# Patient Record
Sex: Female | Born: 1990 | Hispanic: No | Marital: Single | Smoking: Light tobacco smoker
Health system: Southern US, Community
[De-identification: ages and names within clinical notes are randomized; demographics above are authoritative.]

## PROBLEM LIST (undated history)

## (undated) DIAGNOSIS — F329 Major depressive disorder, single episode, unspecified: Secondary | ICD-10-CM

## (undated) DIAGNOSIS — J45909 Unspecified asthma, uncomplicated: Secondary | ICD-10-CM

## (undated) DIAGNOSIS — O0990 Supervision of high risk pregnancy, unspecified, unspecified trimester: Secondary | ICD-10-CM

## (undated) DIAGNOSIS — F32A Depression, unspecified: Secondary | ICD-10-CM

## (undated) DIAGNOSIS — G43909 Migraine, unspecified, not intractable, without status migrainosus: Secondary | ICD-10-CM

## (undated) DIAGNOSIS — Z789 Other specified health status: Secondary | ICD-10-CM

## (undated) DIAGNOSIS — E669 Obesity, unspecified: Secondary | ICD-10-CM

## (undated) DIAGNOSIS — Z8619 Personal history of other infectious and parasitic diseases: Secondary | ICD-10-CM

## (undated) DIAGNOSIS — F419 Anxiety disorder, unspecified: Secondary | ICD-10-CM

## (undated) HISTORY — DX: Anxiety disorder, unspecified: F41.9

## (undated) HISTORY — DX: Depression, unspecified: F32.A

## (undated) HISTORY — PX: WISDOM TOOTH EXTRACTION: SHX21

## (undated) HISTORY — DX: Personal history of other infectious and parasitic diseases: Z86.19

## (undated) HISTORY — DX: Other specified health status: Z78.9

## (undated) HISTORY — DX: Obesity, unspecified: E66.9

## (undated) HISTORY — DX: Migraine, unspecified, not intractable, without status migrainosus: G43.909

## (undated) HISTORY — PX: TONSILLECTOMY AND ADENOIDECTOMY: SUR1326

---
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
## 2012-10-04 DIAGNOSIS — F410 Panic disorder [episodic paroxysmal anxiety] without agoraphobia: Secondary | ICD-10-CM | POA: Diagnosis present

## 2018-02-21 DIAGNOSIS — F411 Generalized anxiety disorder: Secondary | ICD-10-CM | POA: Diagnosis present

## 2018-10-26 NOTE — L&D Delivery Note (Addendum)
Delivery Note At 9:30 AM a viable female was delivered via Vaginal, Spontaneous (Presentation: vertex; LOP with compound hand).  APGAR: 9, 9; weight see delivery summary.   Placenta status: Spontaneous, intact.  Cord: 3 vessel with the following complications: body cord, reduced after delivery.  Anesthesia: Epidural  Episiotomy: None Lacerations: 2nd degree;Vaginal, 1st degree superficial near introitus-hemostatic Suture Repair: 3.0 vicryl rapide Est. Blood Loss (mL): 228  Mom to postpartum.  Baby to Couplet care / Skin to Skin.  Gerlene Fee, DO Family Medicine PGY-1 05/31/2019, 10:06 AM  Midwife attestation: I was gloved and present for delivery in its entirety and I agree with the above resident's note.  Julianne Handler, CNM 10:10 AM

## 2018-11-08 DIAGNOSIS — Z3400 Encounter for supervision of normal first pregnancy, unspecified trimester: Secondary | ICD-10-CM | POA: Insufficient documentation

## 2018-11-08 DIAGNOSIS — Z87898 Personal history of other specified conditions: Secondary | ICD-10-CM

## 2018-11-08 DIAGNOSIS — F1291 Cannabis use, unspecified, in remission: Secondary | ICD-10-CM

## 2019-04-25 ENCOUNTER — Other Ambulatory Visit (HOSPITAL_COMMUNITY): Payer: Self-pay | Admitting: *Deleted

## 2019-04-25 ENCOUNTER — Encounter (HOSPITAL_COMMUNITY): Payer: Self-pay | Admitting: *Deleted

## 2019-04-25 DIAGNOSIS — O99213 Obesity complicating pregnancy, third trimester: Secondary | ICD-10-CM

## 2019-05-01 ENCOUNTER — Ambulatory Visit (HOSPITAL_COMMUNITY): Payer: Medicaid Other

## 2019-05-05 ENCOUNTER — Encounter (HOSPITAL_COMMUNITY): Payer: Self-pay

## 2019-05-09 ENCOUNTER — Other Ambulatory Visit: Payer: Self-pay

## 2019-05-09 ENCOUNTER — Ambulatory Visit (HOSPITAL_COMMUNITY)
Admission: RE | Admit: 2019-05-09 | Discharge: 2019-05-09 | Disposition: A | Discharge status: Home / Self Care | Payer: Medicaid Other | Source: Ambulatory Visit | Attending: Obstetrics and Gynecology | Admitting: Obstetrics and Gynecology

## 2019-05-09 ENCOUNTER — Encounter (HOSPITAL_COMMUNITY): Payer: Self-pay

## 2019-05-09 ENCOUNTER — Ambulatory Visit (HOSPITAL_COMMUNITY): Payer: Medicaid Other | Admitting: *Deleted

## 2019-05-09 VITALS — BP 119/77 | HR 90 | Temp 98.5°F | Ht 66.0 in

## 2019-05-09 DIAGNOSIS — O0990 Supervision of high risk pregnancy, unspecified, unspecified trimester: Secondary | ICD-10-CM | POA: Insufficient documentation

## 2019-05-09 DIAGNOSIS — Z3A36 36 weeks gestation of pregnancy: Secondary | ICD-10-CM | POA: Diagnosis not present

## 2019-05-09 DIAGNOSIS — O24419 Gestational diabetes mellitus in pregnancy, unspecified control: Secondary | ICD-10-CM | POA: Diagnosis not present

## 2019-05-09 DIAGNOSIS — O99213 Obesity complicating pregnancy, third trimester: Secondary | ICD-10-CM | POA: Insufficient documentation

## 2019-05-09 DIAGNOSIS — O0933 Supervision of pregnancy with insufficient antenatal care, third trimester: Secondary | ICD-10-CM | POA: Diagnosis not present

## 2019-05-09 DIAGNOSIS — O9213 Cracked nipple associated with lactation: Secondary | ICD-10-CM | POA: Diagnosis not present

## 2019-05-09 IMAGING — US US MFM OB DETAIL +14 WK
1 series · 13 of 28 positions shown · non-contrast
Comparison: none

[Series 1: us mfm ob detail +14 wk · 13 of 56 slices shown]
[im 3/56]
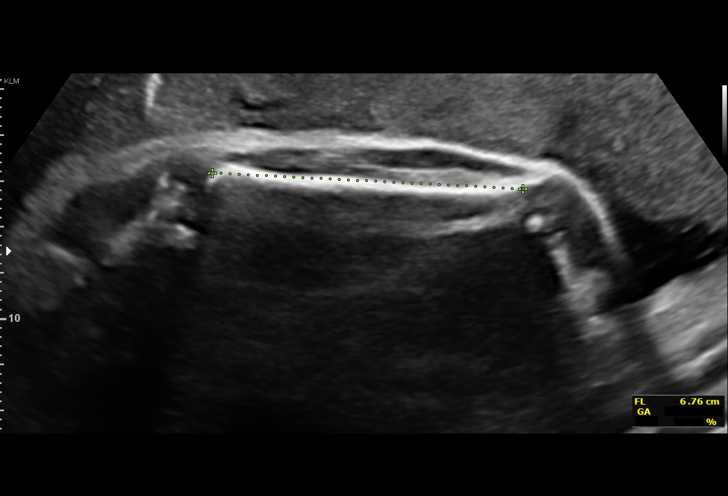
[im 7/56]
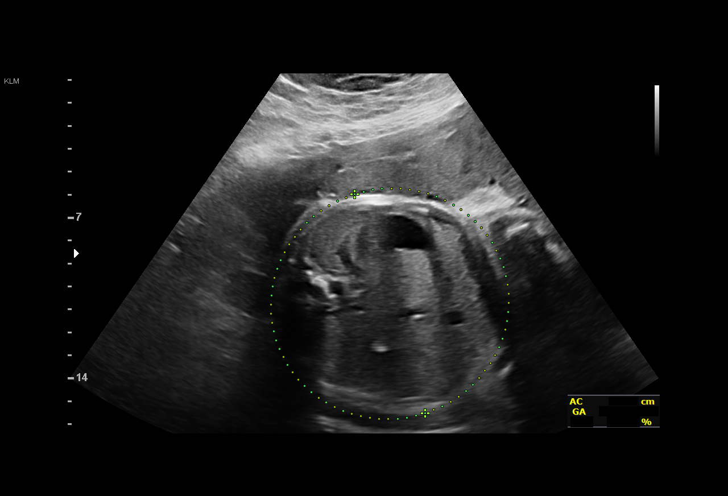
[im 11/56]
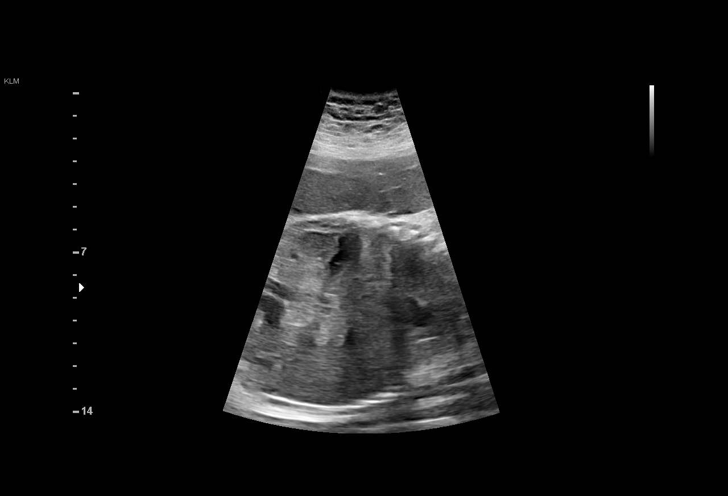
[im 15/56]
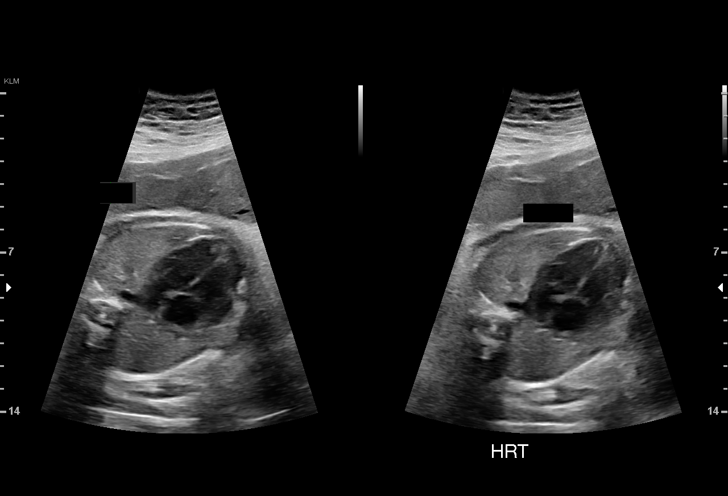
[im 19/56]
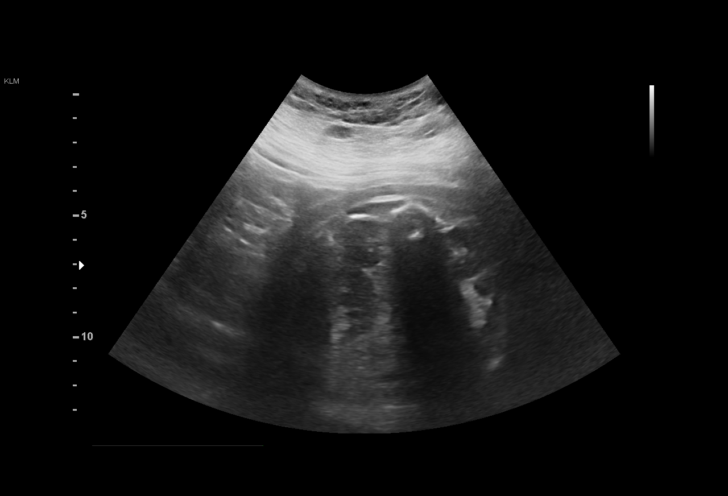
[im 23/56]
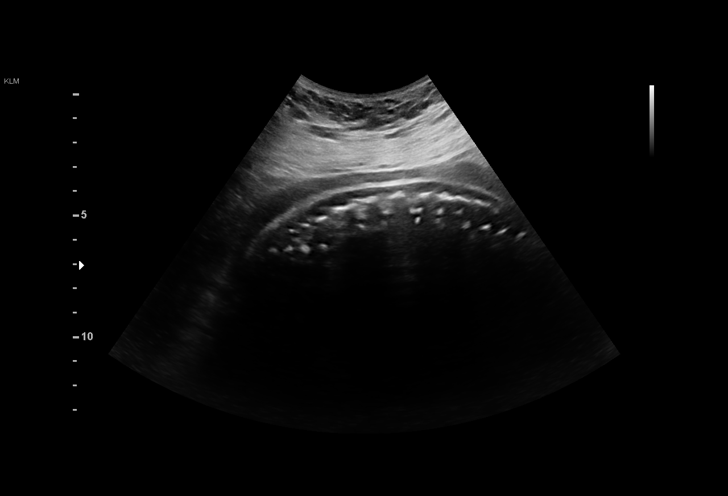
[im 29/56]
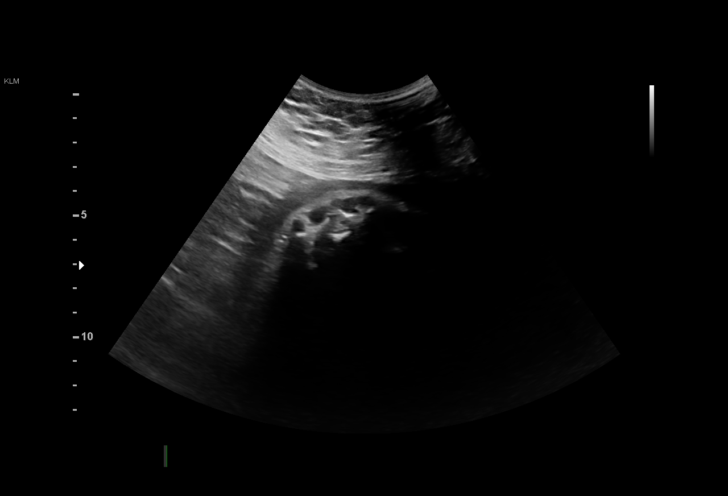
[im 33/56]
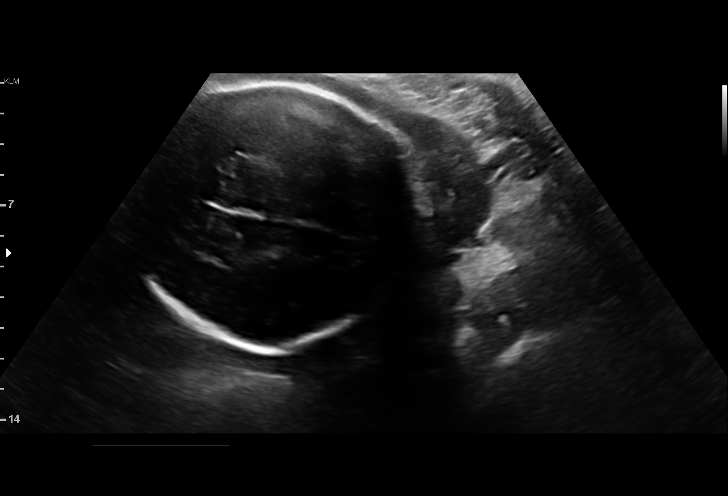
[im 37/56]
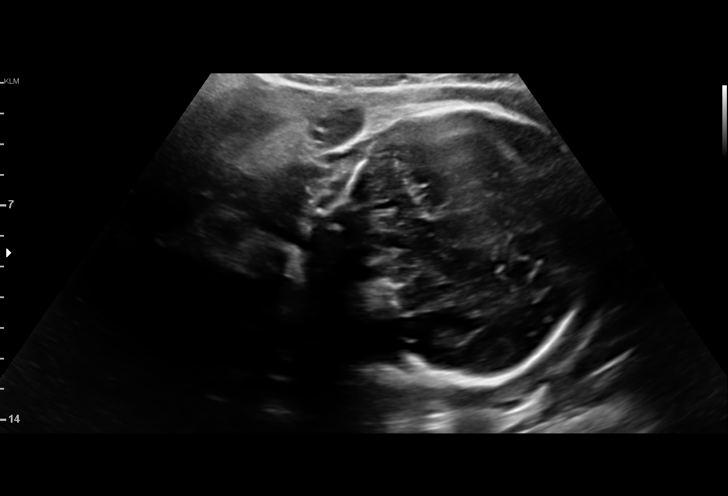
[im 41/56]
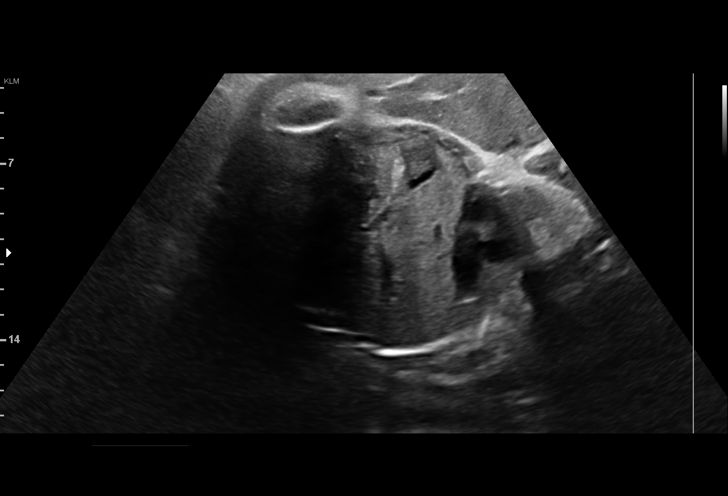
[im 45/56]
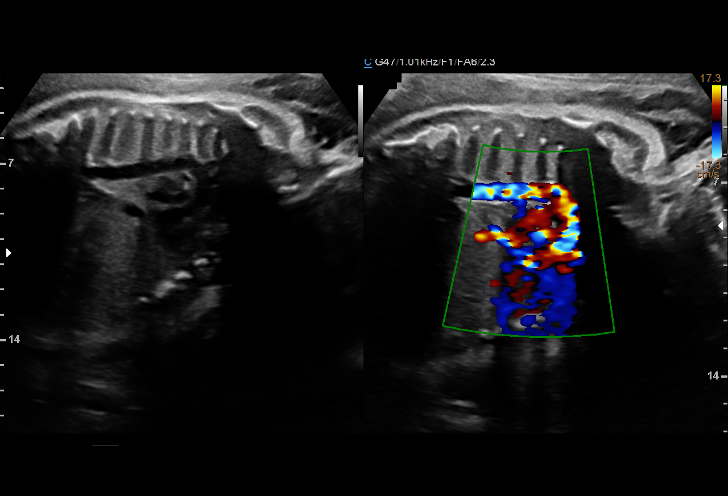
[im 49/56]
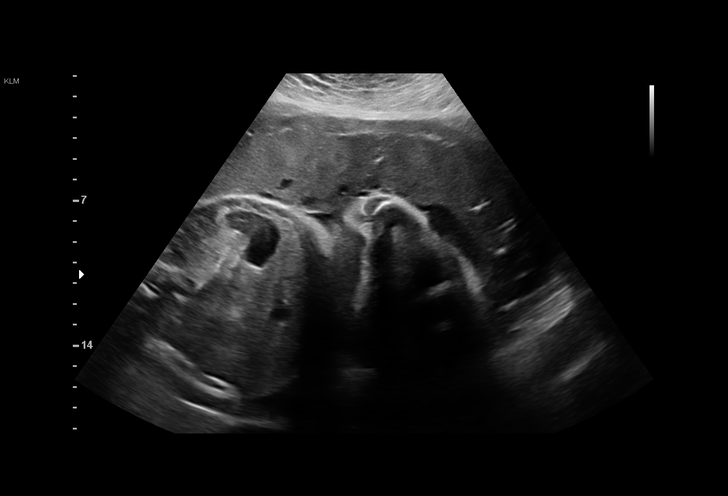
[im 53/56]
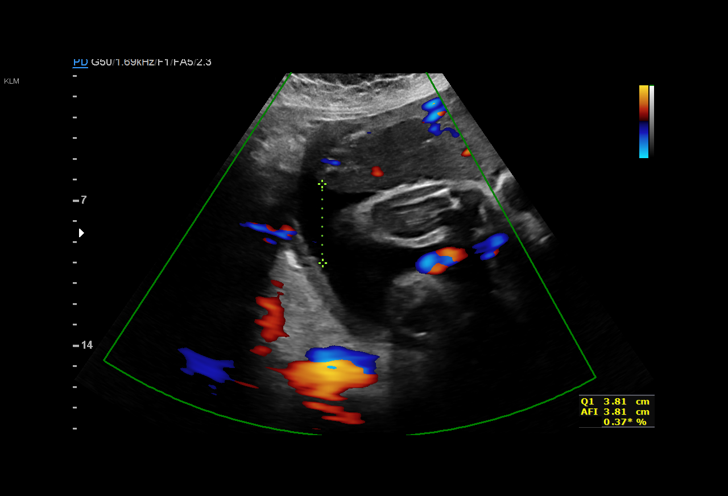

[13 of 28 positions shown; findings below may reference images not displayed]

[facility] NP
 Ref. Address:     [3Z] [HOSPITAL]
                   [HOSPITAL][HOSPITAL]

 ----------------------------------------------------------------------

 ----------------------------------------------------------------------
Indications

  Diabetes - Gestational                         [3Z]
  Encounter for antenatal screening for          [3Z]
  malformations
  36 weeks gestation of pregnancy
  Late to prenatal care, third trimester         [3Z]
  Obesity complicating pregnancy, third          [3Z]
  trimester
 ----------------------------------------------------------------------
Vital Signs

 BMI:
Fetal Evaluation

 Num Of Fetuses:         1
 Fetal Heart Rate(bpm):  145
 Cardiac Activity:       Observed
 Presentation:           Cephalic
 Placenta:               Anterior
 P. Cord Insertion:      Visualized

 Amniotic Fluid
 AFI FV:      Within normal limits

 AFI Sum(cm)     %Tile       Largest Pocket(cm)
 12.5            41

 RUQ(cm)       RLQ(cm)       LUQ(cm)        LLQ(cm)

Biometry

 BPD:      86.2  mm     G. Age:  34w 5d         17  %    CI:         73.6   %    70 - 86
                                                         FL/HC:      21.4   %    20.1 -
 HC:      319.2  mm     G. Age:  36w 0d         13  %    HC/AC:      1.00        0.93 -
 AC:       318   mm     G. Age:  35w 5d         41  %    FL/BPD:     79.4   %    71 - 87
 FL:       68.4  mm     G. Age:  35w 1d         16  %    FL/AC:      21.5   %    20 - 24
 HUM:      61.6  mm     G. Age:  35w 5d         52  %

 Est. FW:    [3Z]  gm    5 lb 15 oz      28  %
OB History

 Gravidity:    3         Term:   0        Prem:   0        SAB:   0
 TOP:          2       Ectopic:  0        Living: 0
Gestational Age

 LMP:           36w 3d        Date:  [DATE]                 EDD:   [DATE]
 U/S Today:     35w 3d                                        EDD:   [DATE]
 Best:          36w 3d     Det. By:  LMP  ([DATE])          EDD:   [DATE]
Anatomy

 Cranium:               Appears normal         Aortic Arch:            Appears normal
 Cavum:                 Appears normal         Ductal Arch:            Not well visualized
 Ventricles:            Appears normal         Diaphragm:              Appears normal
 Choroid Plexus:        Appears normal         Stomach:                Appears normal, left
                                                                       sided
 Cerebellum:            Appears normal         Abdomen:                Appears normal
 Posterior Fossa:       Not well visualized    Abdominal Wall:         Not well visualized
 Nuchal Fold:           Not applicable (>20    Cord Vessels:           Not well visualized
                        wks GA)
 Face:                  Not well visualized    Kidneys:                Appear normal
 Lips:                  Not well visualized    Bladder:                Appears normal
 Thoracic:              Appears normal         Spine:                  Appears normal
 Heart:                 Appears normal         Upper Extremities:      Not well visualized
                        (4CH, axis, and
                        situs)
 RVOT:                  Not well visualized    Lower Extremities:      Appears normal
 LVOT:                  Not well visualized

 Other:  Technically difficult due to advanced GA and fetal position.
Cervix Uterus Adnexa

 Cervix
 Not visualized (advanced GA >[3Z])
Impression

 Ms. ROMI ALEJANDRO3 P0 at 36 [DATE]weeks' gestation, is here for
 ultrasound evaluation. She has sporadic prenatal care.
 Patient reports she has gestational diabetes (GDM) that is
 well-controlled on diet.
 She has transferred her prenatal care to [facility] ([HOSP[facility]
 [facility]. She has a new Ob appointment
 with [facility] tomorrow.
 Fetal growth is appropriate for gestational age. Amniotic fluid
 is normal and good fetal activity is seen. Fetal anatomy
 appears normal, but limited by advanced gestational age.
 We reassured the patient of the findings.
Recommendations

 -Antenatal testing may be initiated if the patient needs oral
 hypoglycemics or insulin for control of GDM.
                 [facility]

## 2019-05-10 ENCOUNTER — Ambulatory Visit (INDEPENDENT_AMBULATORY_CARE_PROVIDER_SITE_OTHER): Payer: Medicaid Other | Admitting: Obstetrics and Gynecology

## 2019-05-10 ENCOUNTER — Other Ambulatory Visit (HOSPITAL_COMMUNITY)
Admission: RE | Admit: 2019-05-10 | Discharge: 2019-05-10 | Disposition: A | Discharge status: Home / Self Care | Payer: Medicaid Other | Source: Ambulatory Visit | Attending: Obstetrics and Gynecology | Admitting: Obstetrics and Gynecology

## 2019-05-10 ENCOUNTER — Encounter: Payer: Self-pay | Admitting: Obstetrics and Gynecology

## 2019-05-10 DIAGNOSIS — O0992 Supervision of high risk pregnancy, unspecified, second trimester: Secondary | ICD-10-CM

## 2019-05-10 DIAGNOSIS — O24410 Gestational diabetes mellitus in pregnancy, diet controlled: Secondary | ICD-10-CM

## 2019-05-10 DIAGNOSIS — O24419 Gestational diabetes mellitus in pregnancy, unspecified control: Secondary | ICD-10-CM | POA: Insufficient documentation

## 2019-05-10 DIAGNOSIS — Z3A36 36 weeks gestation of pregnancy: Secondary | ICD-10-CM

## 2019-05-10 DIAGNOSIS — O0990 Supervision of high risk pregnancy, unspecified, unspecified trimester: Secondary | ICD-10-CM

## 2019-05-10 HISTORY — DX: Supervision of high risk pregnancy, unspecified, unspecified trimester: O09.90

## 2019-05-10 NOTE — Patient Instructions (Signed)

## 2019-05-10 NOTE — Progress Notes (Signed)
History:   Tammy Kerr is a 27 y.o. G3P0020 at 66w4dby LMP being seen today for her first obstetrical visit.  Her obstetrical history is significant for obesity and A1 GDM. Patient does intend to breast feed. Pregnancy history fully reviewed. [name]  Patient reports no complaints. HISTORY: OB History  Gravida Para Term Preterm AB Living  3 0 0 0 2 0  SAB TAB Ectopic Multiple Live Births  0 2 0 0 0    # Outcome Date GA Lbr Len/2nd Weight Sex Delivery Anes PTL Lv  3 Current           2 TAB           1 TAB             Last pap smear was done 5/19 and was normal  Past Medical History:  Diagnosis Date  . Anxiety   . Depression   . History of trichomoniasis   . Medical history non-contributory   . Migraines   . Obesity    Past Surgical History:  Procedure Laterality Date  . TONSILLECTOMY AND ADENOIDECTOMY    . TONSILLECTOMY AND ADENOIDECTOMY    . WISDOM TOOTH EXTRACTION     Family History  Problem Relation Age of Onset  . Ovarian cancer Maternal Grandmother   . Diabetes Maternal Grandmother   . Colon cancer Maternal Grandfather   . Heart disease Maternal Grandfather   . Arthritis Mother   . Depression Mother   . Anxiety disorder Mother   . Obesity Mother    Social History   Tobacco Use  . Smoking status: Light Tobacco Smoker    Types: Cigarettes  . Smokeless tobacco: Never Used  Substance Use Topics  . Alcohol use: Not Currently  . Drug use: Not Currently   No Known Allergies Current Outpatient Medications on File Prior to Visit  Medication Sig Dispense Refill  . Accu-Chek FastClix Lancets MISC Use to monitor blood glucose 4 time(s) daily    . aspirin EC 81 MG tablet Take 81 mg by mouth daily.    . Blood Glucose Monitoring Suppl (ACCU-CHEK GUIDE) w/Device KIT by Does not apply route.    .Marland Kitchenglucose blood (ACCU-CHEK GUIDE) test strip  Use to monitor blood glucose 4 time(s) daily    . Prenatal Vit-Fe Fumarate-FA (PRENATAL VITAMIN PO) Take by mouth.     No current facility-administered medications on file prior to visit.     Review of Systems Pertinent items noted in HPI and remainder of comprehensive ROS otherwise negative. Physical Exam:   Vitals:   05/10/19 0847  BP: 121/74  Pulse: 93  Weight: 261 lb (118.4 kg)   Fetal Heart Rate (bpm): 148 Uterus:  Fundal Height: 37 cm  Pelvic Exam: Perineum: no hemorrhoids, normal perineum   Vulva: normal external genitalia, no lesions   Vagina:  normal mucosa, normal discharge   Cervix: 1 cm, thick.   Adnexa: normal adnexa and no mass, fullness, tenderness   Bony Pelvis: average  System: General: well-developed, well-nourished female in no acute distress   Breasts:  normal appearance, no masses or tenderness bilaterally   Skin: normal coloration and turgor, no rashes   Neurologic: oriented, normal, negative, normal mood   Extremities: normal strength, tone, and muscle mass, ROM of all joints is normal   HEENT PERRLA, extraocular movement intact and sclera clear, anicteric  Mouth/Teeth mucous membranes moist, pharynx normal without lesions and dental hygiene good   Neck supple and no masses   Cardiovascular: regular rate and rhythm   Respiratory:  no respiratory distress, normal breath sounds   Abdomen: soft, non-tender; bowel sounds normal; no masses,  no organomegaly    Assessment:    Pregnancy: [id number] Patient Active Problem List   Diagnosis Date Noted  . Gestational diabetes 05/10/2019  . Supervision of high risk pregnancy, antepartum 05/10/2019     Plan:    1. Diet controlled gestational diabetes mellitus (GDM), antepartum - Patient reports fasting BS less than 95, and postprandial BS < 120. Patient was instructed by [facility] to check BS 2x per day. Will check meter with patient next visit.  - Culture, beta strep (group b only) - Cervicovaginal ancillary  only( [place]) - per MFM: Recommendations -Antenatal testing may be initiated if the patient needs oral  hypoglycemics or insulin for control of GDM - Discussed delivery at 40 weeks if BS remain in good control. - Continue BASA    2. Supervision of other normal pregnancy, antepartum  Virtual visit next week; weekly visits Growth [place] today shows 2391 gm    Awaiting records from [facility] from [place].  Continue prenatal vitamins. Genetic Screening discussed, Declined: declined. Ultrasound discussed; fetal anatomic survey: results reviewed. Problem list reviewed and updated. The nature of [place] with multiple MDs and other Advanced Practice Providers was explained to patient; also emphasized that residents, students are part of our team. Routine obstetric precautions reviewed. Return in about 1 week (around 05/17/2019), or Virtual visit ok._0    Tammy Kerr, Artist Pais, [place] for [employer], [place]

## 2019-05-11 ENCOUNTER — Telehealth: Payer: Self-pay | Admitting: *Deleted

## 2019-05-11 DIAGNOSIS — O24410 Gestational diabetes mellitus in pregnancy, diet controlled: Secondary | ICD-10-CM

## 2019-05-11 LAB — CERVICOVAGINAL ANCILLARY ONLY: Candida vaginitis: POSITIVE — AB

## 2019-05-11 NOTE — Telephone Encounter (Signed)
Spoke with patient about signing up for [place].  She has a BP cuff and she will do her BP and weight weekly and put into the app.  Pt instructed to call if she has any issues with setting up the app.  She was also instructed to take her BP the morning of her virtual visits so that it can be documented.

## 2019-05-12 LAB — CULTURE, BETA STREP (GROUP B ONLY)
MICRO NUMBER:: 669801
SPECIMEN QUALITY:: ADEQUATE

## 2019-05-16 ENCOUNTER — Encounter: Payer: Self-pay | Admitting: Certified Nurse Midwife

## 2019-05-16 ENCOUNTER — Telehealth (INDEPENDENT_AMBULATORY_CARE_PROVIDER_SITE_OTHER): Payer: Medicaid Other | Admitting: Certified Nurse Midwife

## 2019-05-16 VITALS — BP 128/76 | Wt 260.1 lb

## 2019-05-16 DIAGNOSIS — O24410 Gestational diabetes mellitus in pregnancy, diet controlled: Secondary | ICD-10-CM

## 2019-05-16 DIAGNOSIS — Z3A37 37 weeks gestation of pregnancy: Secondary | ICD-10-CM

## 2019-05-16 DIAGNOSIS — O0993 Supervision of high risk pregnancy, unspecified, third trimester: Secondary | ICD-10-CM

## 2019-05-16 DIAGNOSIS — O0990 Supervision of high risk pregnancy, unspecified, unspecified trimester: Secondary | ICD-10-CM

## 2019-05-16 NOTE — Progress Notes (Signed)
routine

## 2019-05-16 NOTE — Progress Notes (Signed)
[place] VIRTUAL VIDEO VISIT ENCOUNTER NOTE  Provider location: [facility] [place] [facility] [place]   I connected with Tammy Kerr on 05/16/19 at 10:00 AM EDT by MyChart Video Encounter at home and verified that I am speaking with the correct person using two identifiers.   I discussed the limitations, risks, security and privacy concerns of performing an evaluation and management service virtually and the availability of in person appointments. I also discussed with the patient that there may be a patient responsible charge related to this service. The patient expressed understanding and agreed to proceed. Subjective:  Tammy Kerr is a 28 y.o. G3P0020 at [redacted]w[redacted]d being seen today for ongoing prenatal care.  She is currently monitored for the following issues for this high-risk pregnancy and has Gestational diabetes and Supervision of high risk pregnancy, antepartum on their problem list.  Patient reports no complaints.  Contractions: Irregular.  .  Movement: Present. Denies any leaking of fluid.   The following portions of the patient's history were reviewed and updated as appropriate: allergies, current medications, past family history, past medical history, past social history, past surgical history and problem list.   Objective:   Vitals:   05/16/19 1038  BP: 128/76  Weight: 260 lb 2.3 oz (118 kg)    Fetal Status:     Movement: Present     General:  Alert, oriented and cooperative. Patient is in no acute distress.  Respiratory: Normal respiratory effort, no problems with respiration noted  Mental Status: Normal mood and affect. Normal behavior. Normal judgment and thought content.  Rest of physical exam deferred due to type of encounter  Imaging: [place] Mfm Ob Detail +14 Wk  Result Date: 05/09/2019 ----------------------------------------------------------------------  OBSTETRICS REPORT                       (Signed Final 05/09/2019 09:17 am)  ---------------------------------------------------------------------- Patient Info  ID #:       [id number]                          D.O.B.:  1991/02/11 (27 yrs)  Name:       Tammy Kerr                   Visit Date: 05/09/2019 08:31 am ---------------------------------------------------------------------- Performed By  Performed By:     Berlinda Last          Secondary Phy.:   Artist Pais                    RDMS                                                             [facility] NP  Attending:        Tama High MD        Address:          [address]  [address] [place],                                                             [place] [id number]  Referred By:      [facility] [place]       Location:         [facility]  Ref. Address:     [address]                    , [place] ---------------------------------------------------------------------- Orders   #  Description                          Code         Ordered By   1  US MFM OB DETAIL +14 WK              [id number]     Arlan OrganANIELA PAUL  ----------------------------------------------------------------------   #  Order #                    Accession #                 Episode #   1  [id number]                  [id number]                  [id number]  ---------------------------------------------------------------------- Indications   Diabetes - Gestational                         75O24.419   Encounter for antenatal screening for          Z36.3   malformations   [redacted] weeks gestation of pregnancy                Z3A.36   Late to prenatal care, third trimester         O09.33   Obesity complicating pregnancy, third          O99.213   trimester  ---------------------------------------------------------------------- Vital Signs  Weight (lb): 260                               Height:        5'6"  BMI:         41.96  ---------------------------------------------------------------------- Fetal Evaluation  Num Of Fetuses:         1  Fetal Heart Rate(bpm):  145  Cardiac Activity:       Observed  Presentation:           Cephalic  Placenta:               Anterior  P. Cord Insertion:      Visualized  Amniotic Fluid  AFI FV:  Within normal limits  AFI Sum(cm)     %Tile       Largest Pocket(cm)  12.5            41          3.97  RUQ(cm)       RLQ(cm)       LUQ(cm)        LLQ(cm)  3.81          1.81          2.91           3.97 ---------------------------------------------------------------------- Biometry  BPD:      86.2  mm     G. Age:  34w 5d         17  %    CI:         73.6   %    70 - 86                                                          FL/HC:      21.4   %    20.1 - 22.1  HC:      319.2  mm     G. Age:  36w 0d         13  %    HC/AC:      1.00        0.93 - 1.11  AC:       318   mm     G. Age:  35w 5d         41  %    FL/BPD:     79.4   %    71 - 87  FL:       68.4  mm     G. Age:  35w 1d         16  %    FL/AC:      21.5   %    20 - 24  HUM:      61.6  mm     G. Age:  35w 5d         52  %  Est. FW:    2691  gm    5 lb 15 oz      28  % ---------------------------------------------------------------------- OB History  Gravidity:    3         Term:   0        Prem:   0        SAB:   0  TOP:          2       Ectopic:  0        Living: 0 ---------------------------------------------------------------------- Gestational Age  LMP:           36w 3d        Date:  08/27/18                 EDD:   06/03/19  U/S Today:     35w 3d                                        EDD:  06/10/19  Best:          36w 3d     Det. By:  LMP  (08/27/18)          EDD:   06/03/19 ---------------------------------------------------------------------- Anatomy  Cranium:               Appears normal         Aortic Arch:            Appears normal  Cavum:                 Appears normal         Ductal Arch:            Not well visualized  Ventricles:             Appears normal         Diaphragm:              Appears normal  Choroid Plexus:        Appears normal         Stomach:                Appears normal, left                                                                        sided  Cerebellum:            Appears normal         Abdomen:                Appears normal  Posterior Fossa:       Not well visualized    Abdominal Wall:         Not well visualized  Nuchal Fold:           Not applicable (>20    Cord Vessels:           Not well visualized                         wks GA)  Face:                  Not well visualized    Kidneys:                Appear normal  Lips:                  Not well visualized    Bladder:                Appears normal  Thoracic:              Appears normal         Spine:                  Appears normal  Heart:                 Appears normal         Upper Extremities:      Not well visualized                         (4CH, axis, and  situs)  RVOT:                  Not well visualized    Lower Extremities:      Appears normal  LVOT:                  Not well visualized  Other:  Technically difficult due to advanced GA and fetal position. ---------------------------------------------------------------------- Cervix Uterus Adnexa  Cervix  Not visualized (advanced GA >24wks) ---------------------------------------------------------------------- Impression  Ms. Deyoung, G3 P0 at 136 3/7weeks' gestation, is here for  ultrasound evaluation. She has sporadic prenatal care.  Patient reports she has gestational diabetes (GDM) that is  well-controlled on diet.  [facility]  Fetal growth is appropriate for gestational age. Amniotic fluid  is normal and good fetal activity is seen. Fetal anatomy  appears normal, but limited by advanced gestational age.  We reassured the patient of the findings.  ---------------------------------------------------------------------- Recommendations  -Antenatal testing may be initiated if the patient needs oral  hypoglycemics or insulin for control of GDM. ----------------------------------------------------------------------                  Noralee Spaceavi Shankar, MD Electronically Signed Final Report   05/09/2019 09:17 am ----------------------------------------------------------------------   Assessment and Plan:  Pregnancy: G3P0020 at 3029w3d 1. Supervision of high risk pregnancy, antepartum -BP 128/76 today  - weight 260lb 2.3oz - Patient doing well, no complaints - Anticipatory guidance on upcoming appointments - Reviewed safety, visitor policy, reassurance about COVID-19 for pregnancy at this time. Discussed possible changes to visits, including televisits, that may occur due to COVID-19.  The office remains open if pt needs to be seen and MAU is open 24 hours/day for OB emergencies. - Educated on use of EPO, RRT, and IC to induce contractions at home  2. Diet controlled gestational diabetes mellitus (GDM) in third trimester - Stable at this time, not on medication  Fasting <90- range from 70, 80, and 86 2hr PP <120 No abnormal CBG levels  - educated and discussed delivery by 40 weeks with A1GDM, patient verbalizes understanding   Term labor symptoms and general obstetric precautions including but not limited to vaginal bleeding, contractions, leaking of fluid and fetal movement were reviewed in detail with the patient. I discussed the assessment and treatment plan with the patient. The patient was provided an opportunity to ask questions and all were answered. The patient agreed with the plan and demonstrated an understanding of the instructions. The patient was advised to call back or seek an in-person office evaluation/[facility] for any urgent or concerning symptoms. Please refer to After Visit Summary for other counseling  recommendations.   I provided 13 minutes of face-to-face time during this encounter.  Return in about 1 week (around 05/23/2019) for ROB-mychart.  Sharyon CableVeronica C Eward Rutigliano, CNM [facility], [facility]

## 2019-05-26 ENCOUNTER — Telehealth: Payer: Self-pay

## 2019-05-26 ENCOUNTER — Other Ambulatory Visit: Payer: Self-pay

## 2019-05-26 ENCOUNTER — Telehealth (INDEPENDENT_AMBULATORY_CARE_PROVIDER_SITE_OTHER): Payer: Medicaid Other | Admitting: Certified Nurse Midwife

## 2019-05-26 DIAGNOSIS — O0993 Supervision of high risk pregnancy, unspecified, third trimester: Secondary | ICD-10-CM

## 2019-05-26 DIAGNOSIS — Z3A38 38 weeks gestation of pregnancy: Secondary | ICD-10-CM

## 2019-05-26 DIAGNOSIS — O24410 Gestational diabetes mellitus in pregnancy, diet controlled: Secondary | ICD-10-CM

## 2019-05-26 DIAGNOSIS — O0990 Supervision of high risk pregnancy, unspecified, unspecified trimester: Secondary | ICD-10-CM

## 2019-05-26 NOTE — Telephone Encounter (Signed)
Attempted to call pt to begin virtual appt. Pt did not answer. VM left and number provided to call office.

## 2019-05-26 NOTE — Progress Notes (Signed)
TELEHEALTH OBSTETRICS PRENATAL VIRTUAL VIDEO VISIT ENCOUNTER NOTE  Provider [facility]: [facility] [place]   I connected with Tammy Kerr on 05/26/19 at 11:00 AM EDT by MyChart Video Encounter at home and verified that I am speaking with the correct person using two identifiers.   I discussed the limitations, risks, security and privacy concerns of performing an evaluation and management service virtually and the availability of in person appointments. I also discussed with the patient that there may be a patient responsible charge related to this service. The patient expressed understanding and agreed to proceed. Subjective:  Tammy Kerr is a 28 y.o. G3P0020 at 5194w6d being seen today for ongoing prenatal care.  She is currently monitored for the following issues for this high-risk pregnancy and has Gestational diabetes and Supervision of high risk pregnancy, antepartum on their problem list.  Patient reports no complaints.  Contractions: Not present.  .  Movement: Present. Denies any leaking of fluid.   The following portions of the patient's history were reviewed and updated as appropriate: allergies, current medications, past family history, past medical history, past social history, past surgical history and problem list.   Objective:   Vitals:   05/26/19 1058  BP: 134/86    Fetal Status:     Movement: Present     General:  Alert, oriented and cooperative. Patient is in no acute distress.  Respiratory: Normal respiratory effort, no problems with respiration noted  Mental Status: Normal mood and affect. Normal behavior. Normal judgment and thought content.  Rest of physical exam deferred due to type of encounter  Imaging: Koreas Mfm Ob Detail +14 Wk  Result Date: 05/09/2019 ----------------------------------------------------------------------  OBSTETRICS REPORT                       (Signed Final 05/09/2019 09:17 am)  ---------------------------------------------------------------------- Patient Info  ID #:       [id number]                          D.O.B.:  1990/12/13 (27 yrs)  Name:       Tammy Kerr                   Visit Date: 05/09/2019 08:31 am ---------------------------------------------------------------------- Performed By  Performed By:     Marcellina MillinKelly L Moser          Secondary Phy.:   Harolyn RutherfordJENNIFER I                    RDMS                                                             [facility] NP  Attending:        Noralee Spaceavi Shankar MD        Address:          [address]                                                             [address] [place],  [place] [address]  Referred By:      Everardo All       Location[facility]         [facility]  Ref. Address:     [address] [place] ---------------------------------------------------------------------- Orders   #  Description                          Code         Ordered By   1  [place] MFM OB DETAIL +14 WK              [id number]     Arlan Organ  ----------------------------------------------------------------------   #  Order #                    Accession #                 Episode #   1  [id number]                  [id number]                  [id number]  ---------------------------------------------------------------------- Indications   Diabetes - Gestational                         O107.419   Encounter for antenatal screening for          Z36.3   malformations   [redacted] weeks gestation of pregnancy                Z3A.36   Late to prenatal care, third trimester         O09.33   Obesity complicating pregnancy, third          O99.213   trimester  ---------------------------------------------------------------------- Vital Signs  Weight (lb): 260                               Height:        5'6"  BMI:         41.96  ---------------------------------------------------------------------- Fetal Evaluation  Num Of Fetuses:         1  Fetal Heart Rate(bpm):  145  Cardiac Activity:       Observed  Presentation:           Cephalic  Placenta:               Anterior  P. Cord Insertion:      Visualized  Amniotic Fluid  AFI FV:      Within normal limits  AFI Sum(cm)     %Tile       Largest Pocket(cm)  12.5            41          3.97  RUQ(cm)       RLQ(cm)       LUQ(cm)  LLQ(cm)  3.81          1.81          2.91           3.97 ---------------------------------------------------------------------- Biometry  BPD:      86.2  mm     G. Age:  34w 5d         17  %    CI:         73.6   %    70 - 86                                                          FL/HC:      21.4   %    20.1 - 22.1  HC:      319.2  mm     G. Age:  36w 0d         13  %    HC/AC:      1.00        0.93 - 1.11  AC:       318   mm     G. Age:  35w 5d         41  %    FL/BPD:     79.4   %    71 - 87  FL:       68.4  mm     G. Age:  35w 1d         16  %    FL/AC:      21.5   %    20 - 24  HUM:      61.6  mm     G. Age:  35w 5d         52  %  Est. FW:    2691  gm    5 lb 15 oz      28  % ---------------------------------------------------------------------- OB History  Gravidity:    3         Term:   0        Prem:   0        SAB:   0  TOP:          2       Ectopic:  0        Living: 0 ---------------------------------------------------------------------- Gestational Age  LMP:           36w 3d        Date:  08/27/18                 EDD:   06/03/19  U/S Today:     35w 3d                                        EDD:   06/10/19  Best:          36w 3d     Det. By:  LMP  (08/27/18)          EDD:   06/03/19 ---------------------------------------------------------------------- Anatomy  Cranium:               Appears normal  Aortic Arch:            Appears normal  Cavum:                 Appears normal         Ductal Arch:            Not well visualized  Ventricles:             Appears normal         Diaphragm:              Appears normal  Choroid Plexus:        Appears normal         Stomach:                Appears normal, left                                                                        sided  Cerebellum:            Appears normal         Abdomen:                Appears normal  Posterior Fossa:       Not well visualized    Abdominal Wall:         Not well visualized  Nuchal Fold:           Not applicable (>54    Cord Vessels:           Not well visualized                         wks GA)  Face:                  Not well visualized    Kidneys:                Appear normal  Lips:                  Not well visualized    Bladder:                Appears normal  Thoracic:              Appears normal         Spine:                  Appears normal  Heart:                 Appears normal         Upper Extremities:      Not well visualized                         (4CH, axis, and                         situs)  RVOT:                  Not well visualized    Lower Extremities:      Appears normal  LVOT:  Not well visualized  Other:  Technically difficult due to advanced GA and fetal position. ---------------------------------------------------------------------- Cervix Uterus Adnexa  Cervix  Not visualized (advanced GA >24wks) ---------------------------------------------------------------------- Impression  Tammy Kerr, G3 P0 at 2436 3/7weeks' gestation, is here for  ultrasound evaluation. She has sporadic prenatal care.  Patient reports she has gestational diabetes (GDM) that is  well-controlled on diet.  [facility].  Fetal growth is appropriate for gestational age. Amniotic fluid  is normal and good fetal activity is seen. Fetal anatomy  appears normal, but limited by advanced gestational age.  We reassured the patient of the findings.  ---------------------------------------------------------------------- Recommendations  -Antenatal testing may be initiated if the patient needs oral  hypoglycemics or insulin for control of GDM. ----------------------------------------------------------------------                  Noralee Spaceavi Shankar, MD Electronically Signed Final Report   05/09/2019 09:17 am ----------------------------------------------------------------------   Assessment and Plan:  Pregnancy: G3P0020 at 957w6d 1. Supervision of high risk pregnancy, antepartum  2. Diet controlled gestational diabetes mellitus (GDM) in third trimester - well controlled w/diet; normal fetal growth and AFI - Reviewed log: FBS 72-93; pp 102-120 (1 outlier 135) - Plan NST/AFI early next week and schedule IOL  Term labor symptoms and general obstetric precautions including but not limited to vaginal bleeding, contractions, leaking of fluid and fetal movement were reviewed in detail with the patient. I discussed the assessment and treatment plan with the patient. The patient was provided an opportunity to ask questions and all were answered. The patient agreed with the plan and demonstrated an understanding of the instructions. The patient was advised to call back or seek an in-person office evaluation/[facility] for any urgent or concerning symptoms. Please refer to After Visit Summary for other counseling recommendations.   I provided 11 minutes of face-to-face time during this encounter.  Return in about 3 days (around 05/29/2019).  No future appointments.  Donette LarryMelanie Ceria Suminski, CNM [facility], [facility]

## 2019-05-30 ENCOUNTER — Other Ambulatory Visit: Payer: Self-pay

## 2019-05-30 ENCOUNTER — Encounter: Payer: Self-pay | Admitting: Advanced Practice Midwife

## 2019-05-30 ENCOUNTER — Encounter (HOSPITAL_COMMUNITY): Payer: Self-pay | Admitting: *Deleted

## 2019-05-30 ENCOUNTER — Inpatient Hospital Stay (HOSPITAL_COMMUNITY)
Admission: AD | Admit: 2019-05-30 | Discharge: 2019-06-01 | DRG: 807 | Disposition: A | Discharge status: Home / Self Care | Payer: Medicaid Other | Attending: Obstetrics and Gynecology | Admitting: Obstetrics and Gynecology

## 2019-05-30 ENCOUNTER — Ambulatory Visit (INDEPENDENT_AMBULATORY_CARE_PROVIDER_SITE_OTHER): Payer: Medicaid Other | Admitting: Advanced Practice Midwife

## 2019-05-30 VITALS — BP 123/79 | HR 73 | Wt 265.0 lb

## 2019-05-30 DIAGNOSIS — O99824 Streptococcus B carrier state complicating childbirth: Secondary | ICD-10-CM | POA: Diagnosis present

## 2019-05-30 DIAGNOSIS — F1291 Cannabis use, unspecified, in remission: Secondary | ICD-10-CM

## 2019-05-30 DIAGNOSIS — O24420 Gestational diabetes mellitus in childbirth, diet controlled: Principal | ICD-10-CM | POA: Diagnosis present

## 2019-05-30 DIAGNOSIS — Z20828 Contact with and (suspected) exposure to other viral communicable diseases: Secondary | ICD-10-CM | POA: Diagnosis present

## 2019-05-30 DIAGNOSIS — O0993 Supervision of high risk pregnancy, unspecified, third trimester: Secondary | ICD-10-CM

## 2019-05-30 DIAGNOSIS — F17210 Nicotine dependence, cigarettes, uncomplicated: Secondary | ICD-10-CM | POA: Diagnosis present

## 2019-05-30 DIAGNOSIS — Z87898 Personal history of other specified conditions: Secondary | ICD-10-CM

## 2019-05-30 DIAGNOSIS — O134 Gestational [pregnancy-induced] hypertension without significant proteinuria, complicating childbirth: Secondary | ICD-10-CM | POA: Diagnosis present

## 2019-05-30 DIAGNOSIS — Z3A39 39 weeks gestation of pregnancy: Secondary | ICD-10-CM

## 2019-05-30 DIAGNOSIS — O0990 Supervision of high risk pregnancy, unspecified, unspecified trimester: Secondary | ICD-10-CM

## 2019-05-30 DIAGNOSIS — Z880 Allergy status to penicillin: Secondary | ICD-10-CM

## 2019-05-30 DIAGNOSIS — O99334 Smoking (tobacco) complicating childbirth: Secondary | ICD-10-CM | POA: Diagnosis present

## 2019-05-30 DIAGNOSIS — O24410 Gestational diabetes mellitus in pregnancy, diet controlled: Secondary | ICD-10-CM

## 2019-05-30 DIAGNOSIS — O288 Other abnormal findings on antenatal screening of mother: Secondary | ICD-10-CM | POA: Diagnosis not present

## 2019-05-30 DIAGNOSIS — O99214 Obesity complicating childbirth: Secondary | ICD-10-CM | POA: Diagnosis present

## 2019-05-30 DIAGNOSIS — O24429 Gestational diabetes mellitus in childbirth, unspecified control: Secondary | ICD-10-CM | POA: Diagnosis not present

## 2019-05-30 DIAGNOSIS — F410 Panic disorder [episodic paroxysmal anxiety] without agoraphobia: Secondary | ICD-10-CM | POA: Diagnosis present

## 2019-05-30 DIAGNOSIS — F411 Generalized anxiety disorder: Secondary | ICD-10-CM | POA: Diagnosis present

## 2019-05-30 DIAGNOSIS — O24419 Gestational diabetes mellitus in pregnancy, unspecified control: Secondary | ICD-10-CM | POA: Diagnosis present

## 2019-05-30 HISTORY — DX: Unspecified asthma, uncomplicated: J45.909

## 2019-05-30 LAB — TYPE AND SCREEN
ABO/RH(D): O POS
Antibody Screen: NEGATIVE

## 2019-05-30 LAB — GLUCOSE, CAPILLARY
Glucose-Capillary: 73 mg/dL (ref 70–99)
Glucose-Capillary: 84 mg/dL (ref 70–99)

## 2019-05-30 LAB — SARS CORONAVIRUS 2 BY RT PCR (HOSPITAL ORDER, PERFORMED IN ~~LOC~~ HOSPITAL LAB): SARS Coronavirus 2: NEGATIVE

## 2019-05-30 LAB — CBC
HCT: 36.9 % (ref 36.0–46.0)
Hemoglobin: 12.6 g/dL (ref 12.0–15.0)
MCH: 31.2 pg (ref 26.0–34.0)
MCHC: 34.1 g/dL (ref 30.0–36.0)
MCV: 91.3 fL (ref 80.0–100.0)
Platelets: 232 10*3/uL (ref 150–400)
RBC: 4.04 MIL/uL (ref 3.87–5.11)
RDW: 13.2 % (ref 11.5–15.5)
WBC: 11.1 10*3/uL — ABNORMAL HIGH (ref 4.0–10.5)
nRBC: 0 % (ref 0.0–0.2)

## 2019-05-30 LAB — ABO/RH: ABO/RH(D): O POS

## 2019-05-30 MED ORDER — MISOPROSTOL 25 MCG QUARTER TABLET
25.0000 ug | ORAL_TABLET | ORAL | Status: DC | PRN
  Administered 2019-05-30: 25 ug via VAGINAL
  Filled 2019-05-30: qty 1

## 2019-05-30 MED ORDER — OXYTOCIN 40 UNITS IN NORMAL SALINE INFUSION - SIMPLE MED
2.5000 [IU]/h | INTRAVENOUS | Status: DC
  Administered 2019-05-31: 10:00:00 2.5 [IU]/h via INTRAVENOUS
  Filled 2019-05-30: qty 1000

## 2019-05-30 MED ORDER — ONDANSETRON HCL 4 MG/2ML IJ SOLN: 4.0000 mg | Freq: Four times a day (QID) | INTRAMUSCULAR | Status: DC | PRN

## 2019-05-30 MED ORDER — PENICILLIN G 3 MILLION UNITS IVPB - SIMPLE MED
3.0000 10*6.[IU] | INTRAVENOUS | Status: DC
  Administered 2019-05-30 – 2019-05-31 (×4): 3 10*6.[IU] via INTRAVENOUS
  Filled 2019-05-30 (×4): qty 100

## 2019-05-30 MED ORDER — MISOPROSTOL 50MCG HALF TABLET
50.0000 ug | ORAL_TABLET | ORAL | Status: DC | PRN
  Administered 2019-05-30 – 2019-05-31 (×2): 50 ug via ORAL
  Filled 2019-05-30 (×2): qty 1

## 2019-05-30 MED ORDER — SODIUM CHLORIDE 0.9 % IV SOLN
5.0000 10*6.[IU] | Freq: Once | INTRAVENOUS | Status: AC
  Administered 2019-05-30: 5 10*6.[IU] via INTRAVENOUS
  Filled 2019-05-30: qty 5

## 2019-05-30 MED ORDER — LACTATED RINGERS IV SOLN
INTRAVENOUS | Status: DC
  Administered 2019-05-30 – 2019-05-31 (×2): via INTRAVENOUS

## 2019-05-30 MED ORDER — TERBUTALINE SULFATE 1 MG/ML IJ SOLN: 0.2500 mg | Freq: Once | INTRAMUSCULAR | Status: DC | PRN

## 2019-05-30 MED ORDER — FENTANYL CITRATE (PF) 100 MCG/2ML IJ SOLN
100.0000 ug | INTRAMUSCULAR | Status: DC | PRN
  Administered 2019-05-31: 100 ug via INTRAVENOUS
  Filled 2019-05-30: qty 2

## 2019-05-30 MED ORDER — LACTATED RINGERS IV SOLN
500.0000 mL | INTRAVENOUS | Status: DC | PRN
  Administered 2019-05-30: 16:00:00 1000 mL via INTRAVENOUS
  Administered 2019-05-31: 500 mL via INTRAVENOUS

## 2019-05-30 MED ORDER — FLEET ENEMA 7-19 GM/118ML RE ENEM: 1.0000 | ENEMA | RECTAL | Status: DC | PRN

## 2019-05-30 MED ORDER — OXYCODONE-ACETAMINOPHEN 5-325 MG PO TABS: 1.0000 | ORAL_TABLET | ORAL | Status: DC | PRN

## 2019-05-30 MED ORDER — LIDOCAINE HCL (PF) 1 % IJ SOLN: 30.0000 mL | INTRAMUSCULAR | Status: DC | PRN

## 2019-05-30 MED ORDER — ACETAMINOPHEN 325 MG PO TABS: 650.0000 mg | ORAL_TABLET | ORAL | Status: DC | PRN

## 2019-05-30 MED ORDER — OXYCODONE-ACETAMINOPHEN 5-325 MG PO TABS: 2.0000 | ORAL_TABLET | ORAL | Status: DC | PRN

## 2019-05-30 MED ORDER — SOD CITRATE-CITRIC ACID 500-334 MG/5ML PO SOLN: 30.0000 mL | ORAL | Status: DC | PRN

## 2019-05-30 MED ORDER — OXYTOCIN BOLUS FROM INFUSION
500.0000 mL | Freq: Once | INTRAVENOUS | Status: AC
  Administered 2019-05-31: 10:00:00 500 mL via INTRAVENOUS

## 2019-05-30 NOTE — Progress Notes (Signed)
Pt is using manual hand pump for nipple stimulation.

## 2019-05-30 NOTE — H&P (Addendum)
OBSTETRIC ADMISSION HISTORY AND PHYSICAL  Tammy Kerr is a 28 y.o. female G67P0020 with IUP at 24w3dby early u/s presenting for IOL for non reactive NST and A1GDM. She reports +FMs, No LOF, no VB, no blurry vision, headaches or peripheral edema, and RUQ pain.  She plans on breast feeding. She is unsure what she wants for birth control. [place] [facility] [place] Dating: By early u/s --->  Estimated Date of Delivery: 06/03/19  Nursing Staff Provider  Office Location  [place]  U/S  Language  English Anatomy [place] Waiting result from [facility]   Flu Vaccine   Genetic Screen  NIPS:   AFP:   First Screen:  Quad:  Declined all   TDaP vaccine    Hgb A1C or  GTT Early  Third trimester Failed 3 hr 93 197 167 107  Rhogam  NA   LAB RESULTS   Feeding Plan Breast Blood Type   O pos  Contraception  Antibody  Neg  Circumcision NA Rubella  Immune  Pediatrician   RPR   NR  Support Person Mother SEnid DerryHBsAg   Neg  Prenatal Classes  HIV  NR  BTL Consent  GBS  (For PCN allergy, check sensitivities)   VBAC Consent  Pap 5/19 NML    Hgb Electro    BP Cuff Pt has cuff CF     SMA     Waterbirth  [ ]  Class [ ]  Consent [ ]  CNM visit   Prenatal History/Complications:  Past Medical History: Past Medical History:  Diagnosis Date  . Anxiety   . Asthma    used inhaler last Dec  . Depression   . History of trichomoniasis   . Medical history non-contributory   . Migraines   . Obesity     Past Surgical History: Past Surgical History:  Procedure Laterality Date  . TONSILLECTOMY AND ADENOIDECTOMY    . TONSILLECTOMY AND ADENOIDECTOMY    . WISDOM TOOTH EXTRACTION      Obstetrical History: OB History    Gravida  3   Para      Term      Preterm      AB  2   Living        SAB      TAB  2   Ectopic      Multiple      Live Births              Social History: Social History   Socioeconomic History  . Marital status: Single    Spouse name: Not on file   . Number of children: Not on file  . Years of education: Not on file  . Highest education level: Not on file  Occupational History  . Not on file  Social Needs  . Financial resource strain: Not on file  . Food insecurity    Worry: Not on file    Inability: Not on file  . Transportation needs    Medical: Not on file    Non-medical: Not on file  Tobacco Use  . Smoking status: Light Tobacco Smoker    Types: Cigarettes  . Smokeless tobacco: Never Used  Substance and Sexual Activity  . Alcohol use: Not Currently  . Drug use: Not Currently  . Sexual activity: Yes  Lifestyle  . Physical activity    Days per week: Not on file    Minutes per session: Not on file  . Stress:  Not on file  Relationships  . Social Herbalist on phone: Not on file    Gets together: Not on file    Attends religious service: Not on file    Active member of club or organization: Not on file    Attends meetings of clubs or organizations: Not on file    Relationship status: Not on file  Other Topics Concern  . Not on file  Social History Narrative  . Not on file    Family History: Family History  Problem Relation Age of Onset  . Ovarian cancer Maternal Grandmother   . Diabetes Maternal Grandmother   . Colon cancer Maternal Grandfather   . Heart disease Maternal Grandfather   . Arthritis Mother   . Depression Mother   . Anxiety disorder Mother   . Obesity Mother     Allergies: No Known Allergies  Medications Prior to Admission  Medication Sig Dispense Refill Last Dose  . Accu-Chek FastClix Lancets MISC Use to monitor blood glucose 4 time(s) daily     . aspirin EC 81 MG tablet Take 81 mg by mouth daily.     . Blood Glucose Monitoring Suppl (ACCU-CHEK GUIDE) w/Device KIT by Does not apply route.     Marland Kitchen glucose blood (ACCU-CHEK GUIDE) test strip Use to monitor blood glucose 4 time(s) daily     . Prenatal Vit-Fe Fumarate-FA (PRENATAL VITAMIN PO) Take by mouth.        Review of  Systems   All systems reviewed and negative except as stated in HPI  Blood pressure 130/80, pulse 81, last menstrual period 08/27/2018. General appearance: alert, cooperative and no distress Lungs: clear to auscultation bilaterally Heart: regular rate and rhythm Abdomen: soft, non-tender; bowel sounds normal Pelvic: n/a Extremities: Homans sign is negative, no sign of DVT DTR's +2 Presentation: cephalic Fetal monitoringBaseline: 150 bpm, Variability: Good {> 6 bpm), Accelerations: Reactive and Decelerations: variable Uterine activityNone     Prenatal labs: Reviewed in Media tab ABO, Rh:   Antibody:   Rubella:   RPR:    HBsAg:    HIV:    GBS:      Prenatal Transfer Tool  Maternal Diabetes: Yes:  Diabetes Type:  Diet controlled Genetic Screening: Normal Maternal Ultrasounds/Referrals: Normal Fetal Ultrasounds or other Referrals:  None Maternal Substance Abuse:  No Significant Maternal Medications:  None Significant Maternal Lab Results: Group B Strep positive  No results found for this or any previous visit (from the past 24 hour(s)).  Patient Active Problem List   Diagnosis Date Noted  . Non-reactive NST (non-stress test) 05/30/2019  . Gestational diabetes 05/10/2019  . Supervision of high risk pregnancy, antepartum 05/10/2019  . History of marijuana use 11/08/2018  . Primigravida, antepartum 11/08/2018  . GAD (generalized anxiety disorder) 02/21/2018  . Panic attacks 10/04/2012    Assessment/Plan:  Tammy Kerr is a 28 y.o. G3P0020 at 79w3dhere for IOL for non reactive NST and A1GDM  #Labor: patient desires low intervention as much as possible. Will attempt FB and cytotec if FHR reactive #Pain: Planning natural, discussed options available #FWB: Cat 2 #ID:  GBS pos #MOF: Breast #MOC: unsure #Circ:  [place] CNM  05/30/2019, 3:32 PM

## 2019-05-30 NOTE — Progress Notes (Signed)
Patient ID: Tammy Kerr, female   DOB: 1991/05/20, 28 y.o.   MRN: [id number] Doing well, only a little cramping  Vitals:   05/30/19 1500 05/30/19 1520 05/30/19 1751 05/30/19 1956  BP: 130/80 122/70 122/68 118/66  Pulse: 81 70 63 68  Resp: 15 18 15 17   Temp: 98 F (36.7 C)     TempSrc: Oral     Weight: 120.5 kg     Height: 5\' 6"  (1.676 m)      FHR reassuring but not tracing well Wants intermittent monitoring but Dr Kennon Rounds recommends continuous EFM  Dilation: Fingertip Effacement (%): Thick Cervical Position: Posterior Station: -3 Presentation: Vertex Exam by:: Resident  Due for cytotec at 2200 Will change to 61mcg PO

## 2019-05-30 NOTE — MAU Note (Signed)
Covid swab collected. PT tolerated well. PT asymptomatic 

## 2019-05-30 NOTE — Progress Notes (Signed)
LABOR PROGRESS NOTE  Tammy Kerr is a 28 y.o. G3P0020 at [redacted]w[redacted]d  admitted for IOL for non reactive NST and A1GDM.  Subjective: She is doing well resting in bed as well as intermittent nipple stimulation with the breast pump. She is not experiencing discomfort at this time.  Objective: BP 122/68   Pulse 63   Temp 98 F (36.7 C) (Oral)   Resp 15   Ht 5\' 6"  (1.676 m)   Wt 120.5 kg   LMP 08/27/2018 (Exact Date)   BMI 42.87 kg/m  or  Vitals:   05/30/19 1500 05/30/19 1520 05/30/19 1751  BP: 130/80 122/70 122/68  Pulse: 81 70 63  Resp: 15 18 15   Temp: 98 F (36.7 C)    TempSrc: Oral    Weight: 120.5 kg    Height: 5\' 6"  (1.676 m)       Dilation: Fingertip Effacement (%): Thick Cervical Position: Posterior Station: -3 Presentation: Vertex Exam by:: Dr. Janus Molder FHT: baseline rate 135bpm, moderate varibility, 15 x 15 acel, no decel Toco: minimal  Labs: Lab Results  Component Value Date   WBC 11.1 (H) 05/30/2019   HGB 12.6 05/30/2019   HCT 36.9 05/30/2019   MCV 91.3 05/30/2019   PLT 232 05/30/2019    Patient Active Problem List   Diagnosis Date Noted  . Non-reactive NST (non-stress test) 05/30/2019  . Gestational diabetes 05/10/2019  . Supervision of high risk pregnancy, antepartum 05/10/2019  . History of marijuana use 11/08/2018  . Primigravida, antepartum 11/08/2018  . GAD (generalized anxiety disorder) 02/21/2018  . Panic attacks 10/04/2012    Assessment / Plan: 28 y.o. G3P0020 at [redacted]w[redacted]d here for IOL for non reactive NST and A1GDM.  Labor: 76mcg vaginal cytotec given at 1751. Consider FB at next exam. Continue time appropriate cervical exams. Fetal Wellbeing: Cat I, vertex Pain Control:  Maternally supported Anticipated MOD: Vaginal   Gestational diabetes: Initiate CBG monitoring Q4H.  Gerlene Fee, D.O. Family Medicine PGY-1 05/30/2019, 6:04 PM

## 2019-05-31 ENCOUNTER — Inpatient Hospital Stay (HOSPITAL_COMMUNITY): Payer: Medicaid Other | Admitting: Anesthesiology

## 2019-05-31 ENCOUNTER — Encounter (HOSPITAL_COMMUNITY): Payer: Self-pay

## 2019-05-31 DIAGNOSIS — O99824 Streptococcus B carrier state complicating childbirth: Secondary | ICD-10-CM

## 2019-05-31 DIAGNOSIS — O24429 Gestational diabetes mellitus in childbirth, unspecified control: Secondary | ICD-10-CM

## 2019-05-31 DIAGNOSIS — Z3A39 39 weeks gestation of pregnancy: Secondary | ICD-10-CM

## 2019-05-31 LAB — RPR: RPR Ser Ql: NONREACTIVE

## 2019-05-31 LAB — GLUCOSE, CAPILLARY
Glucose-Capillary: 96 mg/dL (ref 70–99)
Glucose-Capillary: 74 mg/dL (ref 70–99)

## 2019-05-31 MED ORDER — FENTANYL-BUPIVACAINE-NACL 0.5-0.125-0.9 MG/250ML-% EP SOLN
12.0000 mL/h | EPIDURAL | Status: DC | PRN
  Filled 2019-05-31: qty 250

## 2019-05-31 MED ORDER — DIBUCAINE (PERIANAL) 1 % EX OINT
1.0000 "application " | TOPICAL_OINTMENT | CUTANEOUS | Status: DC | PRN
  Administered 2019-06-01: 1 via RECTAL
  Filled 2019-05-31: qty 28

## 2019-05-31 MED ORDER — PHENYLEPHRINE 40 MCG/ML (10ML) SYRINGE FOR IV PUSH (FOR BLOOD PRESSURE SUPPORT): 80.0000 ug | PREFILLED_SYRINGE | INTRAVENOUS | Status: DC | PRN

## 2019-05-31 MED ORDER — ONDANSETRON HCL 4 MG PO TABS: 4.0000 mg | ORAL_TABLET | ORAL | Status: DC | PRN

## 2019-05-31 MED ORDER — LIDOCAINE HCL (PF) 1 % IJ SOLN
INTRAMUSCULAR | Status: DC | PRN
  Administered 2019-05-31 (×2): 4 mL via EPIDURAL

## 2019-05-31 MED ORDER — OXYTOCIN 40 UNITS IN NORMAL SALINE INFUSION - SIMPLE MED
1.0000 m[IU]/min | INTRAVENOUS | Status: DC
  Administered 2019-05-31: 06:00:00 2 m[IU]/min via INTRAVENOUS

## 2019-05-31 MED ORDER — BENZOCAINE-MENTHOL 20-0.5 % EX AERO
1.0000 "application " | INHALATION_SPRAY | CUTANEOUS | Status: DC | PRN
  Administered 2019-06-01: 1 via TOPICAL
  Filled 2019-05-31: qty 56

## 2019-05-31 MED ORDER — EPHEDRINE 5 MG/ML INJ: 10.0000 mg | INTRAVENOUS | Status: DC | PRN

## 2019-05-31 MED ORDER — DIPHENHYDRAMINE HCL 50 MG/ML IJ SOLN: 12.5000 mg | INTRAMUSCULAR | Status: DC | PRN

## 2019-05-31 MED ORDER — TETANUS-DIPHTH-ACELL PERTUSSIS 5-2.5-18.5 LF-MCG/0.5 IM SUSP
0.5000 mL | Freq: Once | INTRAMUSCULAR | Status: AC
  Administered 2019-06-01: 19:00:00 0.5 mL via INTRAMUSCULAR
  Filled 2019-05-31: qty 0.5

## 2019-05-31 MED ORDER — LACTATED RINGERS IV SOLN
500.0000 mL | Freq: Once | INTRAVENOUS | Status: AC
  Administered 2019-05-31: 04:00:00 500 mL via INTRAVENOUS

## 2019-05-31 MED ORDER — WITCH HAZEL-GLYCERIN EX PADS
1.0000 "application " | MEDICATED_PAD | CUTANEOUS | Status: DC | PRN
  Administered 2019-06-01: 1 via TOPICAL

## 2019-05-31 MED ORDER — DIPHENHYDRAMINE HCL 25 MG PO CAPS: 25.0000 mg | ORAL_CAPSULE | Freq: Four times a day (QID) | ORAL | Status: DC | PRN

## 2019-05-31 MED ORDER — LACTATED RINGERS IV SOLN: 500.0000 mL | Freq: Once | INTRAVENOUS | Status: DC

## 2019-05-31 MED ORDER — SIMETHICONE 80 MG PO CHEW: 80.0000 mg | CHEWABLE_TABLET | ORAL | Status: DC | PRN

## 2019-05-31 MED ORDER — PRENATAL MULTIVITAMIN CH
1.0000 | ORAL_TABLET | Freq: Every day | ORAL | Status: DC
  Administered 2019-06-01: 1 via ORAL
  Filled 2019-05-31: qty 1

## 2019-05-31 MED ORDER — SODIUM CHLORIDE (PF) 0.9 % IJ SOLN
INTRAMUSCULAR | Status: DC | PRN
  Administered 2019-05-31: 12 mL/h via EPIDURAL

## 2019-05-31 MED ORDER — ACETAMINOPHEN 325 MG PO TABS
650.0000 mg | ORAL_TABLET | ORAL | Status: DC | PRN
  Administered 2019-05-31: 22:00:00 650 mg via ORAL
  Filled 2019-05-31: qty 2

## 2019-05-31 MED ORDER — SENNOSIDES-DOCUSATE SODIUM 8.6-50 MG PO TABS
2.0000 | ORAL_TABLET | ORAL | Status: DC
  Administered 2019-05-31: 2 via ORAL
  Filled 2019-05-31: qty 2

## 2019-05-31 MED ORDER — COCONUT OIL OIL: 1.0000 "application " | TOPICAL_OIL | Status: DC | PRN

## 2019-05-31 MED ORDER — IBUPROFEN 600 MG PO TABS
600.0000 mg | ORAL_TABLET | Freq: Four times a day (QID) | ORAL | Status: DC
  Administered 2019-05-31 – 2019-06-01 (×6): 600 mg via ORAL
  Filled 2019-05-31 (×6): qty 1

## 2019-05-31 MED ORDER — ZOLPIDEM TARTRATE 5 MG PO TABS: 5.0000 mg | ORAL_TABLET | Freq: Every evening | ORAL | Status: DC | PRN

## 2019-05-31 MED ORDER — ONDANSETRON HCL 4 MG/2ML IJ SOLN: 4.0000 mg | INTRAMUSCULAR | Status: DC | PRN

## 2019-05-31 NOTE — Progress Notes (Signed)
OB/GYN Faculty Practice: Labor Progress Note  Subjective: Patient doing well without complaints.   Objective: BP 119/63   Pulse 74   Temp 97.9 F (36.6 C)   Resp 19   Ht 5\' 6"  (1.676 m)   Wt 120.5 kg   LMP 08/27/2018 (Exact Date)   SpO2 98%   BMI 42.87 kg/m  Gen: well appearing, NAD Dilation: Lip/rim Effacement (%): 100 Cervical Position: Middle Station: 0 Presentation: Vertex Exam by:: Dr.Prestina Raigoza  FHT: baseline 150bpm, mod variability, no accels, recurrent variable decels Toco: quiet (pt feeling contractions q2-3 minutes)  Assessment and Plan: 28 y.o. E0F0071 [redacted]w[redacted]d IOL-NRNST  Labor: s/p cytotec x4. FB in place. Pit started @0615 . Went to examine patient as she was having recurrent variables on FHT. On cervical exam patient complete 10/100/0 with bulging bag. AROM performed @0800  with clear fluid. Patient tolerated procedure well. Will practice push and continue to monitor -- pain control: CSE -- PPH Risk: low  Fetal Well-Being: EFW 2961 (28%ile) at 36w u/s. Cephalic by cervical exam.  -- Category 2 - continuous fetal monitoring  -- GBS positive- adequate ppx with PCN  A1DM  --q4 BGL --sugars have been well controlled  Anxiety Panic attacks --stable not on meds  Corliss Blacker, [place] 8:09 AM

## 2019-05-31 NOTE — Progress Notes (Signed)
OB/GYN Faculty Practice: Labor Progress Note  Subjective: Patient doing well without complaints.  Objective: BP 137/66   Pulse 65   Temp (!) 97.2 F (36.2 C) (Axillary)   Resp 17   Ht 5\' 6"  (1.676 m)   Wt 120.5 kg   LMP 08/27/2018 (Exact Date)   BMI 42.87 kg/m  Gen: well appearing, NAD Dilation: 1 Effacement (%): 50 Cervical Position: Posterior Station: -3 Presentation: Vertex Exam by:: Dr.Japji Kok  Assessment and Plan: 28 y.o. T2W5809 [redacted]w[redacted]d IOL-NRNST  Labor: s/p cytotec x2. FB placed and inflated to 80cc. Risks/benefits of procedure discussed with patient, patient in agreement. Repeat cytotec 50 mcg buccal at this time. Continue time appropriate cervical exams and initiate pit when able. -- pain control: fentanyl PRN -- PPH Risk: low  Fetal Well-Being: EFW 2961 (28%ile) at 36w u/s. Cephalic by cervical exam.  -- Category 1 - continuous fetal monitoring  -- GBS positive- adequate ppx with PCN  A1DM  --q4 BGL --sugars have been well controlled  Anxiety Panic attacks --stable not on meds  Corliss Blacker, PGY-III Family Medicine 2:47 AM

## 2019-05-31 NOTE — Progress Notes (Signed)
This note also relates to the following rows which could not be included: Dose (milli-units/min) Oxytocin - Cannot attach notes to extension rows Rate (mL/hr) Oxytocin - Cannot attach notes to extension rows Concentration Oxytocin - Cannot attach notes to extension rows  SVD viable baby girl, skin to skin with mother

## 2019-05-31 NOTE — Progress Notes (Signed)
   PRENATAL VISIT NOTE  Subjective:  Tammy Kerr is a 28 y.o. G3P1021 at [redacted]w[redacted]d being seen today for ongoing prenatal care.  She is currently monitored for the following issues for this high-risk pregnancy and has Gestational diabetes; Supervision of high risk pregnancy, antepartum; Non-reactive NST (non-stress test); GAD (generalized anxiety disorder); History of marijuana use; Panic attacks; Primigravida, antepartum; and Vaginal delivery on their problem list.  Patient reports no complaints.  Contractions: Irritability. Vag. Bleeding: None.  Movement: Present. Denies leaking of fluid.   The following portions of the patient's history were reviewed and updated as appropriate: allergies, current medications, past family history, past medical history, past social history, past surgical history and problem list.   Objective:   Vitals:   05/30/19 1014  BP: 123/79  Pulse: 73  Weight: 265 lb (120.2 kg)    Fetal Status:     Movement: Present  Presentation: Vertex  General:  Alert, oriented and cooperative. Patient is in no acute distress.  Skin: Skin is warm and dry. No rash noted.   Cardiovascular: Normal heart rate noted  Respiratory: Normal respiratory effort, no problems with respiration noted  Abdomen: Soft, gravid, appropriate for gestational age.  Pain/Pressure: Absent     Pelvic: Cervical exam performed        Extremities: Normal range of motion.     Mental Status: Normal mood and affect. Normal behavior. Normal judgment and thought content.   Assessment and Plan:  Pregnancy: G3P1021 at [redacted]w[redacted]d 1. Supervision of high risk pregnancy, antepartum   2. Diet controlled gestational diabetes mellitus (GDM) in third trimester --No glucose log but pt reports fasting and PP glucose all wnl.  3. NST (non-stress test) nonreactive --Pt with variables in office, moderate variability and 15 x 15 accel x 1.  Pt to L&D for IOL for nonreactive NST at 39 weeks with GDM.   Term labor symptoms  and general obstetric precautions including but not limited to vaginal bleeding, contractions, leaking of fluid and fetal movement were reviewed in detail with the patient. Please refer to After Visit Summary for other counseling recommendations.   No follow-ups on file.  No future appointments.  Fatima Blank, CNM

## 2019-05-31 NOTE — Progress Notes (Signed)
First push with contractions, movement noted.

## 2019-05-31 NOTE — Anesthesia Postprocedure Evaluation (Signed)
Anesthesia Post Note  Patient: Tammy Kerr  Procedure(s) Performed: AN AD [place]     Patient location during evaluation: Mother Baby Anesthesia Type: Epidural Level of consciousness: awake Pain management: satisfactory to patient Vital Signs Assessment: post-procedure vital signs reviewed and stable Respiratory status: spontaneous breathing Cardiovascular status: stable Anesthetic complications: no    Last Vitals:  Vitals:   05/31/19 1230 05/31/19 1330  BP: 130/70 116/65  Pulse: 63 60  Resp: 20 18  Temp: 36.8 C 37 C  SpO2:      Last Pain:  Vitals:   05/31/19 1415  TempSrc:   PainSc: 0-No pain   Pain Goal:                   [employer]

## 2019-05-31 NOTE — Progress Notes (Signed)
OB/GYN Faculty Practice: Labor Progress Note  Subjective: Patient doing well without complaints.  Objective: BP (!) 116/44   Pulse 60   Temp (!) 97.2 F (36.2 C) (Axillary)   Resp 16   Ht 5\' 6"  (1.676 m)   Wt 120.5 kg   LMP 08/27/2018 (Exact Date)   SpO2 98%   BMI 42.87 kg/m  Gen: well appearing, NAD Dilation: 1 Effacement (%): 50 Cervical Position: Posterior Station: -3 Presentation: Vertex Exam by:: Dr.Shaquala Broeker  FHT: baseline 150bpm, mod variability, +accels, no decels Toco: quiet  Assessment and Plan: 28 y.o. [id number] [redacted]w[redacted]d IOL-NRNST  Labor: s/p cytotec x3. FB placed and inflated to 80cc @0130 , still in place on this cervical exam. Will initiate pitocin at this time. Continue time appropriate cervical exams and initiate pit when able. -- pain control: fentanyl PRN -- PPH Risk: low  Fetal Well-Being: EFW 2961 (28%ile) at 36w u/s. Cephalic by cervical exam.  -- Category 1 - continuous fetal monitoring  -- GBS positive- adequate ppx with PCN  A1DM  --q4 BGL --sugars have been well controlled  Anxiety Panic attacks --stable not on meds  Tammy Kerr, [place] 6:08 AM

## 2019-05-31 NOTE — Discharge Summary (Signed)
Postpartum Discharge Summary     Patient Name: Tammy Kerr DOB: 01/25/1991 MRN: [id number]  Date of admission: 05/30/2019 Delivering Provider: Julianne Handler   Date of discharge: 06/01/2019  Admitting diagnosis: Induction Intrauterine pregnancy: [redacted]w[redacted]d    Secondary diagnosis:  Principal Problem:   Non-reactive NST (non-stress test) Active Problems:   Gestational diabetes   GAD (generalized anxiety disorder)   History of marijuana use   Panic attacks   Vaginal delivery  Additional problems: GHTN     Discharge diagnosis: Term Pregnancy Delivered, Gestational Hypertension and GDM A1                                                                                                Post partum procedures:None  Augmentation: AROM, Pitocin, Cytotec and Foley Balloon  Complications: None  Hospital course:  Induction of Labor With Vaginal Delivery   28y.o. yo [id number] 28w4das admitted to the hospital 05/30/2019 for induction of labor.  Indication for induction: A1 DM and Nonreactive NST.  Patient had an uncomplicated labor course as follows: Membrane Rupture Time/Date: 8:00 AM ,05/31/2019   Intrapartum Procedures: Episiotomy: None [1]                                         Lacerations:  2nd degree [3];Vaginal [6]  Patient had delivery of a Viable infant.  Information for the patient's newborn:  Tammy Kerr, [place]0[[id number]]Delivery Method: Vaginal, Spontaneous(Filed from Delivery Summary)    05/31/2019  Details of delivery can be found in separate delivery note.  Patient had a routine postpartum course. Patient is discharged home 06/01/19. BP mildly elevated in labor. Nml PP.  Denies HA, vision changes or epigastric pain.  Magnesium Sulfate recieved: No BMZ received: No  Physical exam  Vitals:   05/31/19 1700 05/31/19 2151 06/01/19 0130 06/01/19 0500  BP: 125/75 130/82 119/70 122/65  Pulse: 68 69 60 (!) 56  Resp: _0 Temp: 98 F (36.7 C) 98 F (36.7 C) 97.7  F (36.5 C) 98.5 F (36.9 C)  TempSrc: Oral Oral    SpO2:  100% 100%   Weight:      Height:       General: alert, cooperative and no distress Lochia: appropriate Uterine Fundus: firm Incision: N/A DVT Evaluation: No evidence of DVT seen on physical exam. Labs: Lab Results  Component Value Date   WBC 12.3 (H) 06/01/2019   HGB 11.2 (L) 06/01/2019   HCT 33.1 (L) 06/01/2019   MCV 93.2 06/01/2019   PLT 169 06/01/2019   No flowsheet data found.  Discharge instruction: per After Visit Summary and "Baby and Me Booklet". Pre-E precautions.   After visit meds:  Allergies as of 06/01/2019   No Known Allergies     Medication List    STOP taking these medications   Accu-Chek Guide w/Device Kit     TAKE these medications   aspirin EC 81 MG tablet Take 81 mg by mouth daily.  ibuprofen 600 MG tablet Commonly known as: ADVIL Take 1 tablet (600 mg total) by mouth every 6 (six) hours.   PRENATAL VITAMIN PO Take by mouth.       Diet: routine diet  Activity: Advance as tolerated. Pelvic rest for 6 weeks.   Outpatient follow up:6 weeks. PP GTT Follow up Appt: Future Appointments  Date Time Provider [place]  06/05/2019  1:30 PM [place] [facility]  07/11/2019  8:30 AM Lajean Manes, CNM [facility]   Follow up Visit: [facility]([place] On 06/03/2019.   Why: calling for a Sat. appt. 06/03/19 Contact information: [phone] fax           Please schedule this patient for Postpartum visit in: 6 weeks with the following provider: Any provider For C/S patients schedule nurse incision check in weeks 2 weeks: no High risk pregnancy complicated by: [id number], GHTN Delivery mode:  SVD Anticipated Birth Control: Abstinence  PP Procedures needed: BP Check and GTT  Schedule Integrated [place] visit: no      Newborn Data: Live born female  Birth Weight:   APGAR: 39, 9  Newborn Delivery   Birth  date/time: 05/31/2019 09:30:00 Delivery type: Vaginal, Spontaneous      Baby Feeding: Breast Disposition:home with mother   06/01/2019 Manya Silvas, CNM

## 2019-05-31 NOTE — Anesthesia Procedure Notes (Signed)
Epidural Patient location during procedure: OB Start time: 05/31/2019 3:46 AM End time: 05/31/2019 3:50 AM  Staffing Anesthesiologist: Brennan Bailey, MD Performed: anesthesiologist   Preanesthetic Checklist Completed: patient identified, pre-op evaluation, timeout performed, IV checked, risks and benefits discussed and monitors and equipment checked  Epidural Patient position: sitting Prep: site prepped and draped and DuraPrep Patient monitoring: continuous pulse ox, blood pressure and heart rate Approach: midline Location: L3-L4 Injection technique: LOR air  Needle:  Needle type: Tuohy  Needle gauge: 17 G Needle length: 9 cm Needle insertion depth: 8 cm Catheter type: closed end flexible Catheter size: 19 Gauge Catheter at skin depth: 13 cm Test dose: negative and Other (1% lidocaine)  Assessment Events: blood not aspirated, injection not painful, no injection resistance, negative IV test and no paresthesia  Additional Notes Patient identified. Risks, benefits, and alternatives discussed with patient including but not limited to bleeding, infection, nerve damage, paralysis, failed block, incomplete pain control, headache, blood pressure changes, nausea, vomiting, reactions to medication, itching, and postpartum back pain. Confirmed with bedside nurse the patient's most recent platelet count. Confirmed with patient that they are not currently taking any anticoagulation, have any bleeding history, or any family history of bleeding disorders. Patient expressed understanding and wished to proceed. All questions were answered. Sterile technique was used throughout the entire procedure. Please see nursing notes for vital signs.   Crisp LOR after one needle redirection. Test dose was given through epidural catheter and negative prior to continuing to dose epidural or start infusion. Warning signs of high block given to the patient including shortness of breath, tingling/numbness in  hands, complete motor block, or any concerning symptoms with instructions to call for help. Patient was given instructions on fall risk and not to get out of bed. All questions and concerns addressed with instructions to call with any issues or inadequate analgesia. Pt more comfortable with contractions prior to my leaving room.Reason for block:procedure for pain

## 2019-05-31 NOTE — Anesthesia Preprocedure Evaluation (Signed)
Anesthesia Evaluation  Patient identified by MRN, date of birth, ID band Patient awake    Reviewed: Allergy & Precautions, Patient's Chart, lab work & pertinent test results  History of Anesthesia Complications Negative for: history of anesthetic complications  Airway Mallampati: II  TM Distance: >3 FB Neck ROM: Full    Dental no notable dental hx.    Pulmonary asthma , Current Smoker,    Pulmonary exam normal        Cardiovascular negative cardio ROS Normal cardiovascular exam     Neuro/Psych  Headaches, Anxiety Depression    GI/Hepatic negative GI ROS, Neg liver ROS,   Endo/Other  diabetes, GestationalMorbid obesity  Renal/GU negative Renal ROS     Musculoskeletal negative musculoskeletal ROS (+)   Abdominal   Peds  Hematology negative hematology ROS (+)   Anesthesia Other Findings Day of surgery medications reviewed with the patient.  Reproductive/Obstetrics (+) Pregnancy                             Anesthesia Physical Anesthesia Plan  ASA: III  Anesthesia Plan: Epidural   Post-op Pain Management:    Induction:   PONV Risk Score and Plan: Treatment may vary due to age or medical condition  Airway Management Planned: Natural Airway  Additional Equipment:   Intra-op Plan:   Post-operative Plan:   Informed Consent: I have reviewed the patients History and Physical, chart, labs and discussed the procedure including the risks, benefits and alternatives for the proposed anesthesia with the patient or authorized representative who has indicated his/her understanding and acceptance.       Plan Discussed with:   Anesthesia Plan Comments:         Anesthesia Quick Evaluation

## 2019-06-01 LAB — CBC
HCT: 33.1 % — ABNORMAL LOW (ref 36.0–46.0)
Hemoglobin: 11.2 g/dL — ABNORMAL LOW (ref 12.0–15.0)
MCH: 31.5 pg (ref 26.0–34.0)
MCHC: 33.8 g/dL (ref 30.0–36.0)
MCV: 93.2 fL (ref 80.0–100.0)
Platelets: 169 10*3/uL (ref 150–400)
RBC: 3.55 MIL/uL — ABNORMAL LOW (ref 3.87–5.11)
RDW: 13.5 % (ref 11.5–15.5)
WBC: 12.3 10*3/uL — ABNORMAL HIGH (ref 4.0–10.5)
nRBC: 0 % (ref 0.0–0.2)

## 2019-06-01 LAB — GLUCOSE, CAPILLARY: Glucose-Capillary: 77 mg/dL (ref 70–99)

## 2019-06-01 MED ORDER — IBUPROFEN 600 MG PO TABS: 600.0000 mg | ORAL_TABLET | Freq: Four times a day (QID) | ORAL | 0 refills | Status: AC

## 2019-06-01 NOTE — Lactation Note (Signed)
This note was copied from a baby's chart. Lactation Consultation Note  Patient Name: Tammy Kerr [id number] Date: 06/01/2019 Reason for consult: Follow-up assessment;Infant < 6lbs;Term Baby is 28 hours old/6% weight loss.  Mom feels like baby is feeding well.  Assisted with positioning baby in football hold.  Breast compressible.  After several attempts baby latched well to breast.  Instructed on waking techniques and breast massage.  Reviewed feeding cues and instructed to feed with any cue but at least every 3 hours.  Mom has a pump at home and recommended she begin post pumping every 3 hours.giving expressed milk back to baby.  Mom does have formula at home if baby doesn't latch she will offer formula.  Discussed milk coming to volume and the prevention and treatment of engorgement.  Questions answered.  Reviewed lactation outpatient services and encouraged to call prn.  Maternal Data    Feeding Feeding Type: Breast Fed  LATCH Score Latch: Grasps breast easily, tongue down, lips flanged, rhythmical sucking.  Audible Swallowing: A few with stimulation  Type of Nipple: Everted at rest and after stimulation  Comfort (Breast/Nipple): Soft / non-tender  Hold (Positioning): Assistance needed to correctly position infant at breast and maintain latch.  LATCH Score: 8  Interventions Interventions: Assisted with latch;Skin to skin;Breast massage;Breast compression;Adjust position;Support pillows  Lactation Tools Discussed/Used     Consult Status Consult Status: Complete Follow-up type: Call as needed    Ave Filter 06/01/2019, 2:01 PM

## 2019-06-01 NOTE — Progress Notes (Signed)
Fasting blood sugar at 0500 this morning was 77

## 2019-06-01 NOTE — Lactation Note (Signed)
This note was copied from a baby's chart. Lactation Consultation Note Baby 20 hrs old. Mom states baby has been having a shallow latch at times. Baby is off and on the breast a lot lately per mom. Newborn feeding habits, STS, I&O, positioning, breast massage, supplementing, supply and demand discussed. Baby gets on the breast will suckle for a short time and stop and sleep.  Discussed stimulation and supplementing. Mom requested formula d/t mom felt the baby was hungry. Mom has tubular large breast and flat compressible nipples.  Hand expression w/colostrum noted. Hand pump given for pre-pumping to evert nipples prior to feeding. Shells given to wear in bra today to evert nipples prior to latching. Mom requested formula d/t she felt the baby was hungry and mom has GDM. No jitteriness noted to baby. Gerber given in bottle. Baby chewed and tongue thruster on nipple.  Baby took a little formula. Discussed w/mom possible nipple confusion if bottle fed a lot. Encouraged mom to call for assistance or questions. Lactation brochure given.  Patient Name: Tammy Kerr EUMPN'T Date: 06/01/2019 Reason for consult: Initial assessment;Primapara;Term;Maternal endocrine disorder Type of Endocrine Disorder?: Diabetes   Maternal Data Has patient been taught Hand Expression?: Yes Does the patient have breastfeeding experience prior to this delivery?: No  Feeding Feeding Type: Formula Nipple Type: Slow - flow  LATCH Score Latch: Repeated attempts needed to sustain latch, nipple held in mouth throughout feeding, stimulation needed to elicit sucking reflex.  Audible Swallowing: A few with stimulation  Type of Nipple: Flat  Comfort (Breast/Nipple): Soft / non-tender  Hold (Positioning): Assistance needed to correctly position infant at breast and maintain latch.  LATCH Score: 6  Interventions Interventions: Breast feeding basics reviewed;Adjust position;Assisted with latch;Support  pillows;Skin to skin;Position options;Breast massage;Hand express;Pre-pump if needed;Shells;Breast compression;Hand pump  Lactation Tools Discussed/Used Tools: Pump;Shells Shell Type: Inverted Breast pump type: Manual Pump Review: Setup, frequency, and cleaning;Milk Storage Initiated by:: Allayne Stack RN IBCLC Date initiated:: 06/01/19   Consult Status Consult Status: Follow-up Date: 06/02/19 Follow-up type: In-patient    Jere Bostrom, [place] 06/01/2019, 5:12 AM

## 2019-06-01 NOTE — Clinical Social Work Maternal (Signed)
CLINICAL SOCIAL WORK MATERNAL/CHILD NOTE  Patient Details  Name: Tammy Kerr MRN: [id number] Date of Birth: May 12, 1991  Date:  06/01/2019  Clinical Social Worker Initiating Note:  Elijio Miles Date/Time: Initiated:  06/01/19/1142     Child's Name:  Tammy Kerr   Biological Parents:  Mother, Father(Tammy Kerr and Tammy Kerr (FOB not involved))   Need for Interpreter:  None   Reason for Referral:  Behavioral Health Concerns, Current Substance Use/Substance Use During Pregnancy    Address:  [address] [place] [address]    Phone number:  [phone] (home)     Additional phone number:   Household Members/Support Persons (HM/SP):   Household Member/Support Person 1   HM/SP Name Relationship DOB or Age  HM/SP -1   MOB's aunt    HM/SP -2        HM/SP -3        HM/SP -4        HM/SP -5        HM/SP -6        HM/SP -7        HM/SP -8          Natural Supports (not living in the home):  Friends, Artist Supports: Other (Comment)(Doula)   Employment: Part-time   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[employer]:  [place] arranged:    Museum/gallery curator Resources:  Medicaid   Other Resources:  Physicist, medical , [place] Considerations Which May Impact Care:    Strengths:  Ability to meet basic needs , Home prepared for child , Pediatrician chosen   Psychotropic Medications:         Pediatrician:    [facility] [place] (including [place])  Pediatrician List:   [facility]    Pediatrician Fax Number:    Risk Factors/Current Problems:      Cognitive State:  Able to Concentrate , Alert , Insightful , Linear Thinking    Mood/Affect:  Bright , Calm , Comfortable , Interested , Happy , Relaxed    CSW Assessment:  CSW received consult for history of anxiety  and THC use.  CSW met with MOB to offer support and complete assessment.    MOB sitting up in bed breastfeeding infant, when CSW entered the room. MOB's mother also present and at bedside. CSW received verbal permission from MOB to complete assessment with MGM present. MGM did not contribute much to conversation and was observed to be sleeping during assessment. MOB very pleasant and easy to engage throughout assessment and was appropriate and attentive to infant during visit. CSW explained reason for consult to which MOB expressed understanding. MOB reported she currently lives with her aunt in [place]. 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 CSW inquired about MOB's mental health history and MOB openly shared that she was diagnosed with anxiety and depression around the age of 62. MOB stated she has not taken medications in a couple of years. MOB shared that she did experience some anxiety during her pregnancy but attributes it to being pregnant and felt it was manageable. MOB denied any other mental health symptoms or  concerns. CSW provided education regarding the baby blues period vs. perinatal mood disorders, discussed treatment and gave resources for mental health follow up if concerns arise.  CSW recommends self-evaluation during the postpartum time period using the New Mom Checklist from Postpartum Progress and encouraged MOB to contact a medical professional if symptoms are noted at any time. MOB denied any current SI, HI or DV and reported having a very supportive support system consisting of her mother and her friends.   CSW inquired about MOB's substance use history and MOB acknowledged using marijuana in the beginning of her pregnancy but reported she quit soon after finding out she was pregnant. CSW informed MOB of Hospital Drug Policy and explained  UDS and CDS were still pending but that a CPS report would be made, if warranted. CSW explained process of CPS report in the event once needed to be made and MOB denied any questions or concerns regarding policy or report.   MOB confirmed having all essential items for infant once discharged and reported infant would be sleeping in a basinet once home. CSW provided review of Sudden Infant Death Syndrome (SIDS) precautions and safe sleeping habits.     CSW Plan/Description:  No Further Intervention Required/No Barriers to Discharge, Sudden Infant Death Syndrome (SIDS) Education, Perinatal Mood and Anxiety Disorder (PMADs) Education    [place], [place] 06/01/2019, 12:10 PM

## 2019-06-05 ENCOUNTER — Telehealth (INDEPENDENT_AMBULATORY_CARE_PROVIDER_SITE_OTHER): Payer: Medicaid Other | Admitting: *Deleted

## 2019-06-05 ENCOUNTER — Other Ambulatory Visit: Payer: Self-pay

## 2019-06-05 VITALS — BP 135/80 | HR 84

## 2019-06-05 DIAGNOSIS — O0990 Supervision of high risk pregnancy, unspecified, unspecified trimester: Secondary | ICD-10-CM

## 2019-06-05 NOTE — Progress Notes (Signed)
Pt is 1 week post vaginal delivery for virtual BP check.  BP today is 135/80.  She denies any headaches or dizziness.  She is breast feeding.  She is to continue her 81 mg ASA and PN vitamins.  Only c/o she has is some discomfort from the site of her epidural.  She will f/u in 4 weeks for her routine Post partum visit.

## 2019-07-11 ENCOUNTER — Ambulatory Visit: Payer: Medicaid Other | Admitting: Certified Nurse Midwife

## 2019-07-20 ENCOUNTER — Encounter: Payer: Self-pay | Admitting: Certified Nurse Midwife

## 2019-07-20 ENCOUNTER — Other Ambulatory Visit: Payer: Self-pay

## 2019-07-20 ENCOUNTER — Ambulatory Visit (INDEPENDENT_AMBULATORY_CARE_PROVIDER_SITE_OTHER): Payer: Medicaid Other | Admitting: Certified Nurse Midwife

## 2019-07-20 DIAGNOSIS — Z1389 Encounter for screening for other disorder: Secondary | ICD-10-CM | POA: Diagnosis not present

## 2019-07-20 DIAGNOSIS — O24419 Gestational diabetes mellitus in pregnancy, unspecified control: Secondary | ICD-10-CM

## 2019-07-20 NOTE — Progress Notes (Signed)
Post Partum Exam  Tammy Kerr is a 28 y.o. G56P1021 female who presents for a postpartum visit. She is 7 weeks postpartum following a spontaneous vaginal delivery. I have fully reviewed the prenatal and intrapartum course. The delivery was at [redacted]w[redacted]d gestational weeks.  Anesthesia: epidural. Postpartum course has been unremarkable. Baby's course has been unremarkable. Baby is feeding by breast. Bleeding no bleeding. Bowel function is normal. Bladder function is normal. Patient is not sexually active. Contraception method is condoms. Postpartum depression screening:neg  The following portions of the patient's history were reviewed and updated as appropriate: allergies, current medications, past surgical history and problem list. Last pap smear done 02/2018 and was Normal  Review of Systems A comprehensive review of systems was negative.    Objective:  Blood pressure 120/72, pulse 75, resp. rate 16, height 5\' 7"  (1.702 m), weight 244 lb (110.7 kg), last menstrual period 08/27/2018, currently breastfeeding.  General:  alert, cooperative and no distress   Breasts:  inspection negative, no nipple discharge or bleeding, no masses or nodularity palpable  Lungs: clear to auscultation bilaterally  Heart:  regular rate and rhythm  Abdomen: soft, non-tender; bowel sounds normal; no masses,  no organomegaly   Vulva:  not evaluated  Vagina: not evaluated  Cervix:  not evaluated  Corpus: not examined  Adnexa:  not evaluated  Rectal Exam: Not performed.        Assessment:    Normal postpartum exam. Pap smear not done at today's visit.  GTT ordered today to assess for T2DM or confirm diagnoses of GDM  Educated and discussed other birth control option, patient wants non hormonal birth control- discussed diaphragm, spermicide, Phexxi or copper IUD- patient plans on making decision and call office to make appointment.  Plan:   1. Contraception: condoms 2. Follow up in: 3 months for annual examination or  as needed.   Lajean Manes, CNM 07/20/19, 9:18 AM

## 2019-07-21 LAB — 2HR GTT W 1 HR, CARPENTER, 75 G
Glucose, 1 Hr, Gest: 110 mg/dL (ref 65–179)
Glucose, 2 Hr, Gest: 84 mg/dL (ref 65–152)
Glucose, Fasting, Gest: 76 mg/dL (ref 65–91)

## 2019-08-07 ENCOUNTER — Telehealth: Payer: Self-pay | Admitting: *Deleted

## 2019-08-07 MED ORDER — FLUCONAZOLE 150 MG PO TABS: 150.0000 mg | ORAL_TABLET | Freq: Once | ORAL | 0 refills | Status: AC

## 2019-08-07 NOTE — Telephone Encounter (Signed)
Pt called stating that she has a yeast infection and she is afraid that her baby will get thrush.  She is requesting Diflucan be sent to her pharmacy.  RX sent but pt is aware that if not better by the end of the week she will need appt.

## 2020-07-02 ENCOUNTER — Emergency Department
Admission: EM | Admit: 2020-07-02 | Discharge: 2020-07-02 | Disposition: A | Discharge status: Home / Self Care | Payer: Medicaid Other | Source: Home / Self Care | Attending: Family Medicine | Admitting: Family Medicine

## 2020-07-02 ENCOUNTER — Other Ambulatory Visit: Payer: Self-pay

## 2020-07-02 DIAGNOSIS — G43009 Migraine without aura, not intractable, without status migrainosus: Secondary | ICD-10-CM | POA: Diagnosis not present

## 2020-07-02 DIAGNOSIS — M26622 Arthralgia of left temporomandibular joint: Secondary | ICD-10-CM

## 2020-07-02 MED ORDER — KETOROLAC TROMETHAMINE 60 MG/2ML IM SOLN
60.0000 mg | Freq: Once | INTRAMUSCULAR | Status: AC
  Administered 2020-07-02: 60 mg via INTRAMUSCULAR

## 2020-07-02 NOTE — ED Triage Notes (Signed)
Pt c/o last Thurs waking up with a migraine. Vomited because of pain. Woke up again today with a migraine, vomiting due to pain. (3x) Lt ear pain noticed today esp when she opens her jaw, radiates to her jaw. Ibuprofen prn.  No known covid exposure.  Has not had covid vaccinations.

## 2020-07-02 NOTE — Discharge Instructions (Addendum)
If symptoms become significantly worse during the night or over the weekend, proceed to the local emergency room.  

## 2020-07-02 NOTE — ED Provider Notes (Signed)
[facility]    CSN: [id number] Arrival date & time: 07/02/20  1428      History   Chief Complaint Chief Complaint  Patient presents with  . Migraine  . Otalgia    LT; jaw pain    HPI Tammy Kerr is a 29 y.o. female.    Migraine This is a recurrent problem. Episode onset: 5 days ago. The problem occurs constantly. The problem has not changed since onset.Associated symptoms include headaches. Nothing aggravates the symptoms. Nothing relieves the symptoms. Treatments tried: Ibuprofen. The treatment provided mild relief.    Past Medical History:  Diagnosis Date  . Anxiety   . Asthma    used inhaler last Dec  . Depression   . History of trichomoniasis   . Medical history non-contributory   . Migraines   . Obesity   . Supervision of high risk pregnancy, antepartum 05/10/2019    Nursing Staff Provider Office Location  [facility] Dating  U/S Language  English Anatomy US  Waiting result from [facility]  Flu Vaccine   Genetic Screen  NIPS:   AFP:   First Screen:  Quad:  Declined all  TDaP vaccine    Hgb A1C or  GTT Early  Third trimester Failed 3 hr 93 197 167 107 Rhogam  NA   LAB RESULTS  Feeding Plan Breast Blood Type   O pos Contraception  Antibody  Neg Circumcision NA Rubella      Patient Active Problem List   Diagnosis Date Noted  . Vaginal delivery 05/31/2019  . Non-reactive NST (non-stress test) 05/30/2019  . Gestational diabetes 05/10/2019  . History of marijuana use 11/08/2018  . Primigravida, antepartum 11/08/2018  . GAD (generalized anxiety disorder) 02/21/2018  . Panic attacks 10/04/2012    Past Surgical History:  Procedure Laterality Date  . TONSILLECTOMY AND ADENOIDECTOMY    . TONSILLECTOMY AND ADENOIDECTOMY    . WISDOM TOOTH EXTRACTION      OB History    Gravida  3   Para  1   Term  1   Preterm      AB  2   Living  1     SAB      TAB  2   Ectopic      Multiple  0   Live Births  1            Home Medications    Prior to  Admission medications   Medication Sig Start Date End Date Taking? Authorizing Provider  aspirin EC 81 MG tablet Take 81 mg by mouth daily.    [provider]  ibuprofen (ADVIL) 600 MG tablet Take 1 tablet (600 mg total) by mouth every 6 (six) hours. 06/01/19   Katrinka Blazing, [place], CNM  Prenatal Vit-Fe Fumarate-FA (PRENATAL VITAMIN PO) Take by mouth.    [provider]    Family History Family History  Problem Relation Age of Onset  . Ovarian cancer Maternal Grandmother   . Diabetes Maternal Grandmother   . Colon cancer Maternal Grandfather   . Heart disease Maternal Grandfather   . Arthritis Mother   . Depression Mother   . Anxiety disorder Mother   . Obesity Mother     Social History Social History   Tobacco Use  . Smoking status: Light Tobacco Smoker    Types: Cigarettes  . Smokeless tobacco: Never Used  Vaping Use  . Vaping Use: Former  Substance Use Topics  . Alcohol use: Not Currently  . Drug use:  Not Currently     Allergies   Patient has no known allergies.   Review of Systems Review of Systems  Constitutional: Positive for activity change and appetite change. Negative for chills, diaphoresis, fatigue and fever.  HENT: Positive for ear pain. Negative for congestion, ear discharge, facial swelling, sinus pain and tinnitus.   Eyes: Negative.   Respiratory: Negative.   Cardiovascular: Negative.   Gastrointestinal: Positive for nausea and vomiting.  Genitourinary: Negative.   Musculoskeletal: Negative.   Neurological: Positive for headaches. Negative for dizziness, syncope, facial asymmetry, speech difficulty, weakness and light-headedness.  All other systems reviewed and are negative.    Physical Exam Triage Vital Signs ED Triage Vitals  Enc Vitals Group     BP 07/02/20 1440 (!) 143/82     Pulse Rate 07/02/20 1440 77     Resp 07/02/20 1440 18     Temp 07/02/20 1440 98.8 F (37.1 C)     Temp Source 07/02/20 1440 Oral     SpO2 07/02/20  1440 99 %     Weight --      Height --      Head Circumference --      Peak Flow --      Pain Score 07/02/20 1443 4     Pain Loc --      Pain Edu? --      Excl. in GC? --    No data found.  Updated Vital Signs BP (!) 143/82 (BP Location: Right Arm)   Pulse 77   Temp 98.8 F (37.1 C) (Oral)   Resp 18   LMP 06/25/2020   SpO2 99%   Visual Acuity Right Eye Distance:   Left Eye Distance:   Bilateral Distance:    Right Eye Near:   Left Eye Near:    Bilateral Near:     Physical Exam Nursing notes and Vital Signs reviewed. Appearance:  Patient appears stated age, and in no acute distress Eyes:  Pupils are equal, round, and reactive to light and accomodation.  Extraocular movement is intact.  Conjunctivae are not inflamed  Ears:  Canals normal.  Tympanic membranes normal. There is distinct tenderness over the left temporomandibular joint.  Palpation there recreates her pain.  Nose:   Normal turbinates.  No sinus tenderness.  Neck:  Supple.  No adenopathy  Lungs:  Clear to auscultation.  Breath sounds are equal.  Moving air well. Heart:  Regular rate and rhythm without murmurs, rubs, or gallops.  Abdomen:  Nontender without masses or hepatosplenomegaly.  Bowel sounds are present.  No CVA or flank tenderness.  Extremities:  No edema.  Skin:  No rash present.  Neurologic:  Cranial nerves 2 through 12 are normal.  Patellar, achilles, and elbow reflexes are normal.  Cerebellar function is intact (finger-to-nose and rapid alternating hand movement).   Romberg negative.  UC Treatments / Results  Labs (all labs ordered are listed, but only abnormal results are displayed) Labs Reviewed - No data to display  EKG   Radiology No results found.  Procedures Procedures (including critical care time)  Medications Ordered in UC Medications  ketorolac (TORADOL) injection 60 mg (has no administration in time range)    Initial Impression / Assessment and Plan / UC Course  I have  reviewed the triage vital signs and the nursing notes.  Pertinent labs & imaging results that were available during my care of the patient were reviewed by me and considered in my medical decision making (see chart  for details).    Normal neurologic exam reassuring. Administered Toradol 60mg  IM. Followup with neurologist if headaches become more frequent.  Final Clinical Impressions(s) / UC Diagnoses   Final diagnoses:  Migraine without aura and without status migrainosus, not intractable  Arthralgia of left temporomandibular joint     Discharge Instructions     If symptoms become significantly worse during the night or over the weekend, proceed to the local emergency room.     ED Prescriptions    None        , MD 07/06/20 [phone]

## 2023-08-17 ENCOUNTER — Encounter: Payer: Self-pay | Admitting: Obstetrics and Gynecology

## 2023-08-17 DIAGNOSIS — Z3480 Encounter for supervision of other normal pregnancy, unspecified trimester: Secondary | ICD-10-CM | POA: Insufficient documentation

## 2023-08-20 ENCOUNTER — Encounter: Payer: Medicaid Other | Admitting: Obstetrics and Gynecology

## 2023-08-20 DIAGNOSIS — Z3480 Encounter for supervision of other normal pregnancy, unspecified trimester: Secondary | ICD-10-CM

## 2023-08-22 NOTE — Progress Notes (Signed)
Patient did not keep her appt today.  Duane Lope, NP 08/22/2023 9:56 AM

## 2024-04-04 ENCOUNTER — Ambulatory Visit
Admission: EM | Admit: 2024-04-04 | Discharge: 2024-04-04 | Disposition: A | Discharge status: Home / Self Care | Attending: Family Medicine | Admitting: Family Medicine

## 2024-04-04 ENCOUNTER — Telehealth: Payer: Self-pay | Admitting: Family Medicine

## 2024-04-04 ENCOUNTER — Telehealth: Payer: Self-pay

## 2024-04-04 DIAGNOSIS — Z113 Encounter for screening for infections with a predominantly sexual mode of transmission: Secondary | ICD-10-CM | POA: Insufficient documentation

## 2024-04-04 DIAGNOSIS — Z202 Contact with and (suspected) exposure to infections with a predominantly sexual mode of transmission: Secondary | ICD-10-CM

## 2024-04-04 MED ORDER — FLUCONAZOLE 200 MG PO TABS: ORAL_TABLET | ORAL | 0 refills | Status: DC

## 2024-04-04 MED ORDER — METRONIDAZOLE 500 MG PO TABS: 500.0000 mg | ORAL_TABLET | Freq: Two times a day (BID) | ORAL | 0 refills | Status: DC

## 2024-04-04 NOTE — Telephone Encounter (Signed)
 Flagyl and Diflucan  sent to the patient pharmacy for positive BV and candidiasis.

## 2024-04-04 NOTE — ED Provider Notes (Signed)
 [facility]    CSN: [id number] Arrival date & time: 04/04/24  1649      History   Chief Complaint Chief Complaint  Patient presents with   sti check    HPI Deb Loudin is a 33 y.o. female.   HPI 33 year old female presents with changes in vaginal pH since having sex with new partner.  Reports recent treatment for BV and yeast 1 month ago.  Patient reports a spot on her labia that is bothering her swollen and painful.  PMH significant for obesity, depression, and migraines.  Past Medical History:  Diagnosis Date   Anxiety    Asthma    used inhaler last Dec   Depression    History of trichomoniasis    Medical history non-contributory    Migraines    Obesity    Supervision of high risk pregnancy, antepartum 05/10/2019    Nursing Staff Provider Office Location  [facility] Dating  U/S Language  English Anatomy US   Waiting result from [facility]  Flu Vaccine   Genetic Screen  NIPS:   AFP:   First Screen:  Quad:  Declined all  TDaP vaccine    Hgb A1C or  GTT Early  Third trimester Failed 3 hr 93 197 167 107 Rhogam  NA   LAB RESULTS  Feeding Plan Breast Blood Type   O pos Contraception  Antibody  Neg Circumcision NA Rubella      Patient Active Problem List   Diagnosis Date Noted   Supervision of other normal pregnancy, antepartum 08/17/2023   Vaginal delivery 05/31/2019   Non-reactive NST (non-stress test) 05/30/2019   Gestational diabetes 05/10/2019   History of marijuana use 11/08/2018   Primigravida, antepartum 11/08/2018   GAD (generalized anxiety disorder) 02/21/2018   Panic attacks 10/04/2012    Past Surgical History:  Procedure Laterality Date   TONSILLECTOMY AND ADENOIDECTOMY     TONSILLECTOMY AND ADENOIDECTOMY     WISDOM TOOTH EXTRACTION      OB History     Gravida  4   Para  1   Term  1   Preterm      AB  2   Living  1      SAB      IAB  2   Ectopic      Multiple  0   Live Births  1            Home Medications    Prior to  Admission medications   Medication Sig Start Date End Date Taking? Authorizing Provider  aspirin EC 81 MG tablet Take 81 mg by mouth daily.    [provider]  fluconazole  (DIFLUCAN ) 200 MG tablet Take 1 tab p.o. now, may repeat 1 tab p.o. in 3 days if symptoms are not resolved. 04/04/24   Yoshiaki Kreuser, FNP  ibuprofen  (ADVIL ) 600 MG tablet Take 1 tablet (600 mg total) by mouth every 6 (six) hours. 06/01/19   Smith, [place] , CNM  metroNIDAZOLE (FLAGYL) 500 MG tablet Take 1 tablet (500 mg total) by mouth 2 (two) times daily. 04/04/24   Leonides Ramp, FNP  Prenatal Vit-Fe Fumarate-FA (PRENATAL VITAMIN PO) Take by mouth.    [provider]    Family History Family History  Problem Relation Age of Onset   Ovarian cancer Maternal Grandmother    Diabetes Maternal Grandmother    Colon cancer Maternal Grandfather    Heart disease Maternal Grandfather    Arthritis Mother    Depression Mother  Anxiety disorder Mother    Obesity Mother     Social History Social History   Tobacco Use   Smoking status: Light Smoker    Types: Cigarettes   Smokeless tobacco: Never  Vaping Use   Vaping status: Former  Substance Use Topics   Alcohol use: Not Currently   Drug use: Not Currently     Allergies   Patient has no known allergies.   Review of Systems Review of Systems  Genitourinary:        Concern with painful bump on her labia for 3 days  All other systems reviewed and are negative.    Physical Exam Triage Vital Signs ED Triage Vitals  Encounter Vitals Group     BP 04/04/24 1717 132/82     Systolic BP Percentile --      Diastolic BP Percentile --      Pulse Rate 04/04/24 1717 71     Resp 04/04/24 1717 19     Temp 04/04/24 1717 98.5 F (36.9 C)     Temp src --      SpO2 04/04/24 1717 98 %     Weight --      Height --      Head Circumference --      Peak Flow --      Pain Score 04/04/24 1716 0     Pain Loc --      Pain Education --      Exclude from  Growth Chart --    No data found.  Updated Vital Signs BP 132/82   Pulse 71   Temp 98.5 F (36.9 C)   Resp 19   LMP 05/31/2023   SpO2 98%   Breastfeeding Unknown    Physical Exam Vitals and nursing note reviewed. Exam conducted with a chaperone present.  Constitutional:      Appearance: Normal appearance. She is obese.  HENT:     Head: Normocephalic and atraumatic.     Mouth/Throat:     Mouth: Mucous membranes are moist.     Pharynx: Oropharynx is clear.  Eyes:     Extraocular Movements: Extraocular movements intact.     Conjunctiva/sclera: Conjunctivae normal.     Pupils: Pupils are equal, round, and reactive to light.  Cardiovascular:     Rate and Rhythm: Normal rate.     Pulses: Normal pulses.     Heart sounds: Normal heart sounds.  Pulmonary:     Effort: Pulmonary effort is normal.     Breath sounds: Normal breath sounds. No wheezing, rhonchi or rales.  Genitourinary:      Comments: Tiny(1 mm) follicular/pustular, noted Musculoskeletal:        General: Normal range of motion.  Skin:    General: Skin is warm and dry.  Neurological:     General: No focal deficit present.     Mental Status: She is alert and oriented to person, place, and time.  Psychiatric:        Mood and Affect: Mood normal.        Behavior: Behavior normal.      UC Treatments / Results  Labs (all labs ordered are listed, but only abnormal results are displayed) Labs Reviewed  HIV ANTIBODY (ROUTINE TESTING W REFLEX)  RPR  CERVICOVAGINAL ANCILLARY ONLY    EKG   Radiology No results found.  Procedures Procedures (including critical care time)  Medications Ordered in UC Medications - No data to display  Initial Impression / Assessment and Plan /  UC Course  I have reviewed the triage vital signs and the nursing notes.  Pertinent labs & imaging results that were available during my care of the patient were reviewed by me and considered in my medical decision making (see chart  for details).     MDM: 1.  Potential exposure to STD-Aptima swab ordered, HIV antibody routine testing with reflex, RPR ordered per patient request. Advised patient we will follow-up with lab results once received.  Advised if symptoms worsen and/or unresolved please follow-up with your PCP or here for further evaluation.  Patient discharged home, hemodynamically stable. Final Clinical Impressions(s) / UC Diagnoses   Final diagnoses:  Potential exposure to STD     Discharge Instructions      Advised patient we will follow-up with lab results once received.  Advised if symptoms worsen and/or unresolved please follow-up with your PCP or here for further evaluation.   ED Prescriptions   None    PDMP not reviewed this encounter.   Leonides Ramp, FNP 04/04/24 [phone]

## 2024-04-04 NOTE — Discharge Instructions (Addendum)
 Advised patient we will follow-up with lab results once received.  Advised if symptoms worsen and/or unresolved please follow-up with your PCP or here for further evaluation.

## 2024-04-04 NOTE — ED Triage Notes (Signed)
 Pt presents to uc with co changes in vaginal ph since having sex with a new partner.recent treatment for bv and yeast one month ago. Pt reports she has a spot on her labia that is bothing her and is swollen and painful for 3 days ago.

## 2024-04-05 LAB — RPR: RPR Ser Ql: NONREACTIVE

## 2024-04-05 LAB — HIV ANTIBODY (ROUTINE TESTING W REFLEX): HIV Screen 4th Generation wRfx: NONREACTIVE

## 2024-04-06 LAB — CERVICOVAGINAL ANCILLARY ONLY
Bacterial Vaginitis (gardnerella): NEGATIVE
Candida Glabrata: NEGATIVE
Candida Vaginitis: NEGATIVE
Chlamydia: NEGATIVE
Comment: NEGATIVE
Comment: NEGATIVE
Comment: NEGATIVE
Comment: NEGATIVE
Comment: NEGATIVE
Comment: NORMAL
Neisseria Gonorrhea: NEGATIVE
Trichomonas: NEGATIVE

## 2024-09-04 ENCOUNTER — Encounter: Payer: Self-pay | Admitting: Obstetrics & Gynecology

## 2024-09-04 ENCOUNTER — Ambulatory Visit: Admitting: Obstetrics & Gynecology

## 2024-09-04 ENCOUNTER — Other Ambulatory Visit (HOSPITAL_COMMUNITY)
Admission: RE | Admit: 2024-09-04 | Discharge: 2024-09-04 | Disposition: A | Discharge status: Home / Self Care | Source: Ambulatory Visit | Attending: Obstetrics & Gynecology | Admitting: Obstetrics & Gynecology

## 2024-09-04 VITALS — BP 115/71 | HR 61 | Ht 66.5 in | Wt 183.0 lb

## 2024-09-04 DIAGNOSIS — B9689 Other specified bacterial agents as the cause of diseases classified elsewhere: Secondary | ICD-10-CM

## 2024-09-04 DIAGNOSIS — N898 Other specified noninflammatory disorders of vagina: Secondary | ICD-10-CM

## 2024-09-04 DIAGNOSIS — N760 Acute vaginitis: Secondary | ICD-10-CM | POA: Insufficient documentation

## 2024-09-04 DIAGNOSIS — Z01411 Encounter for gynecological examination (general) (routine) with abnormal findings: Secondary | ICD-10-CM

## 2024-09-04 NOTE — Progress Notes (Signed)
 Subjective:     Tammy Kerr is a 33 y.o. female here for a routine exam.  Current complaints: papule in left labia majora just left of center that swells and drains to a black dot a few mm cephalad.  Has had BV several times.  Would be interested in partner treatment (NEJM article)   Gynecologic History Patient's last menstrual period was 08/27/2024. Contraception: condoms Last Pap: 2023. Results were: normal Last mammogram: n/a  Obstetric History OB History  Gravida Para Term Preterm AB Living  4 1 1  2 1   SAB IAB Ectopic Multiple Live Births   2  0 1    # Outcome Date GA Lbr Len/2nd Weight Sex Type Anes PTL Lv  4 Gravida           3 Term 05/31/19 [redacted]w[redacted]d 03:50 / 00:30 6 lb 2.1 oz (2.781 kg) F Vag-Spont EPI  LIV  2 IAB           1 IAB              The following portions of the patient's history were reviewed and updated as appropriate: allergies, current medications, past family history, past medical history, past social history, past surgical history, and problem list.  Review of Systems Pertinent items are noted in HPI.    Objective:     Vitals:   09/04/24 0937  BP: 115/71  Pulse: 61  Weight: 183 lb (83 kg)  Height: 5' 6.5 (1.689 m)   Vitals:  WNL General appearance: alert, cooperative and no distress  HEENT: Normocephalic, without obvious abnormality, atraumatic Eyes: negative Throat: lips, mucosa, and tongue normal; teeth and gums normal  Respiratory: Clear to auscultation bilaterally  CV: Regular rate and rhythm  Breasts:  Normal appearance, no masses or tenderness, no nipple retraction or dimpling  GI: Soft, non-tender; bowel sounds normal; no masses,  no organomegaly  GU: External Genitalia:  Tanner V, small 1/2 pea ized papule when squeezed will drain a white liquid material from a pinpoint hole a few mm cephald Urethra:  No prolapse   Vagina: Pink, normal rugae, no blood, + grey discharge  Cervix: No CMT, no lesion  Uterus:  Normal size and contour,  non tender  Adnexa: Normal, no masses, non tender  Musculoskeletal: No edema, redness or tenderness in the calves or thighs  Skin: No lesions or rash  Lymphatic: Axillary adenopathy: none     Psychiatric: Normal mood and behavior        Assessment:    Healthy female exam.    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  If +history of ovarian cancer--offer genetic testing RTC in 3 weeks for biopsy of labail mass--come 30 mins before to place prilocaine and lidocaine  pre meds.   Vaginal discharge and history of BV--Aptima sent (interested in partner treatment if positive)

## 2024-09-05 LAB — CERVICOVAGINAL ANCILLARY ONLY
Bacterial Vaginitis (gardnerella): NEGATIVE
Candida Glabrata: NEGATIVE
Candida Vaginitis: NEGATIVE
Comment: NEGATIVE
Comment: NEGATIVE
Comment: NEGATIVE

## 2024-09-09 ENCOUNTER — Ambulatory Visit: Payer: Self-pay | Admitting: Obstetrics & Gynecology

## 2024-10-02 ENCOUNTER — Ambulatory Visit: Admitting: Obstetrics & Gynecology

## 2024-11-06 ENCOUNTER — Ambulatory Visit: Admitting: Obstetrics & Gynecology
